# Patient Record
Sex: Male | Born: 1995 | Race: Black or African American | Hispanic: No | Marital: Single | Smoking: Never smoker
Health system: Southern US, Community
[De-identification: ages and names within clinical notes are randomized; demographics above are authoritative.]

## PROBLEM LIST (undated history)

## (undated) DIAGNOSIS — R569 Unspecified convulsions: Secondary | ICD-10-CM

## (undated) DIAGNOSIS — G43909 Migraine, unspecified, not intractable, without status migrainosus: Secondary | ICD-10-CM

## (undated) HISTORY — DX: Migraine, unspecified, not intractable, without status migrainosus: G43.909

---

## 2017-02-08 ENCOUNTER — Emergency Department (HOSPITAL_COMMUNITY)
Admission: EM | Admit: 2017-02-08 | Discharge: 2017-02-09 | Disposition: A | Discharge status: Home / Self Care | Payer: PRIVATE HEALTH INSURANCE | Attending: Emergency Medicine | Admitting: Emergency Medicine

## 2017-02-08 DIAGNOSIS — R509 Fever, unspecified: Secondary | ICD-10-CM | POA: Diagnosis not present

## 2017-02-08 DIAGNOSIS — Z79899 Other long term (current) drug therapy: Secondary | ICD-10-CM | POA: Insufficient documentation

## 2017-02-08 MED ORDER — ACETAMINOPHEN 325 MG PO TABS
650.0000 mg | ORAL_TABLET | Freq: Once | ORAL | Status: AC | PRN
  Administered 2017-02-08: 650 mg via ORAL
  Filled 2017-02-08: qty 2

## 2017-02-08 NOTE — ED Triage Notes (Addendum)
Pt reports weakness, headache and fatigue since he woke up this morning. He states that he doesn't know if he had a seizure today, but he has had one in the past. Denies abdominal pain at this time, but states that he had some earlier this week. He reports "feeling an aura." A&Ox4, but groggy. Ambulatory.

## 2017-02-09 ENCOUNTER — Emergency Department (HOSPITAL_COMMUNITY): Payer: PRIVATE HEALTH INSURANCE

## 2017-02-09 LAB — COMPREHENSIVE METABOLIC PANEL
ALBUMIN: 4 g/dL (ref 3.5–5.0)
ALK PHOS: 67 U/L (ref 38–126)
ALT: 35 U/L (ref 17–63)
AST: 27 U/L (ref 15–41)
Anion gap: 10 (ref 5–15)
BILIRUBIN TOTAL: 0.6 mg/dL (ref 0.3–1.2)
BUN: 9 mg/dL (ref 6–20)
CALCIUM: 8.8 mg/dL — AB (ref 8.9–10.3)
CO2: 24 mmol/L (ref 22–32)
CREATININE: 1.23 mg/dL (ref 0.61–1.24)
Chloride: 104 mmol/L (ref 101–111)
GFR calc Af Amer: >60 mL/min (ref >60)
GFR calc non Af Amer: >60 mL/min (ref >60)
GLUCOSE: 98 mg/dL (ref 65–99)
Potassium: 4.2 mmol/L (ref 3.5–5.1)
SODIUM: 138 mmol/L (ref 135–145)
Total Protein: 8.1 g/dL (ref 6.5–8.1)

## 2017-02-09 LAB — URINALYSIS, ROUTINE W REFLEX MICROSCOPIC
Bilirubin Urine: NEGATIVE
Glucose, UA: NEGATIVE mg/dL
HGB URINE DIPSTICK: NEGATIVE
KETONES UR: 5 mg/dL — AB
Leukocytes, UA: NEGATIVE
Nitrite: NEGATIVE
PROTEIN: NEGATIVE mg/dL
SPECIFIC GRAVITY, URINE: 1.03 (ref 1.005–1.030)
pH: 6 (ref 5.0–8.0)

## 2017-02-09 LAB — CBC WITH DIFFERENTIAL/PLATELET
Basophils Absolute: 0 10*3/uL (ref 0.0–0.1)
Basophils Relative: 1 %
EOS ABS: 0 10*3/uL (ref 0.0–0.7)
EOS PCT: 0 %
HCT: 44.8 % (ref 39.0–52.0)
Hemoglobin: 14.9 g/dL (ref 13.0–17.0)
LYMPHS ABS: 1.4 10*3/uL (ref 0.7–4.0)
Lymphocytes Relative: 23 %
MCH: 29 pg (ref 26.0–34.0)
MCHC: 33.3 g/dL (ref 30.0–36.0)
MCV: 87.2 fL (ref 78.0–100.0)
MONO ABS: 0.5 10*3/uL (ref 0.1–1.0)
MONOS PCT: 9 %
Neutro Abs: 4.3 10*3/uL (ref 1.7–7.7)
Neutrophils Relative %: 67 %
PLATELETS: 254 10*3/uL (ref 150–400)
RBC: 5.14 MIL/uL (ref 4.22–5.81)
RDW: 12.9 % (ref 11.5–15.5)
WBC: 6.3 10*3/uL (ref 4.0–10.5)

## 2017-02-09 LAB — I-STAT CG4 LACTIC ACID, ED: Lactic Acid, Venous: 0.94 mmol/L (ref 0.5–1.9)

## 2017-02-09 MED ORDER — ONDANSETRON 8 MG PO TBDP: 8.0000 mg | ORAL_TABLET | Freq: Three times a day (TID) | ORAL | 0 refills | Status: DC | PRN

## 2017-02-09 MED ORDER — IBUPROFEN 200 MG PO TABS
600.0000 mg | ORAL_TABLET | Freq: Once | ORAL | Status: AC
  Administered 2017-02-09: 600 mg via ORAL
  Filled 2017-02-09: qty 3

## 2017-02-09 NOTE — ED Provider Notes (Signed)
[facility]-EMERGENCY DEPT Provider Note   CSN: [id number] Arrival date & time: 02/08/17  2055     History   Chief Complaint Chief Complaint  Patient presents with  . Fatigue  . Fever    HPI Dylan Shepard is a 21 y.o. male.  HPI Patient reports fever over the past 24 hours with associated nausea.  Reports some crampy abdominal pain but no abdominal pain this time.  Denies diarrhea.  Denies vomiting.  Reports headache and decreased oral intake today.  Feels better after IV fluids.  Denies sore throat or earache.  No recent sick contacts.  No rash.  Symptoms are mild to moderate in severity.  He did not try any medication first fever prior to arrival.   No past medical history on file.  There are no active problems to display for this patient.   No past surgical history on file.     Home Medications    Prior to Admission medications   Medication Sig Start Date End Date Taking? Authorizing Provider  levETIRAcetam (KEPPRA) 500 MG tablet Take 1,250 mg by mouth 2 (two) times daily.   Yes [provider]    Family History No family history on file.  Social History Social History  Substance Use Topics  . Smoking status: Not on file  . Smokeless tobacco: Not on file  . Alcohol use Not on file     Allergies   Patient has no known allergies.   Review of Systems Review of Systems  All other systems reviewed and are negative.    Physical Exam Updated Vital Signs BP 121/61   Pulse 68   Temp (!) 102.5 F (39.2 C) (Oral)   Resp 18   Ht 5\' 4"  (1.626 m)   SpO2 100%   Physical Exam  Constitutional: He is oriented to person, place, and time.  HENT:  Head: Normocephalic and atraumatic.  Posterior pharynx is normal.  Uvula is midline.  Bilateral TMs are normal.  Eyes: EOM are normal.  Neck: Normal range of motion.  Cardiovascular: Normal rate, regular rhythm and intact distal pulses.   Pulmonary/Chest: Effort normal and breath sounds normal. No respiratory  distress.  Abdominal: He exhibits no distension. There is no tenderness.  Musculoskeletal: Normal range of motion.  Neurological: He is alert and oriented to person, place, and time.  Skin: Skin is warm and dry. No rash noted.  Nursing note and vitals reviewed.    ED Treatments / Results  Labs (all labs ordered are listed, but only abnormal results are displayed) Labs Reviewed  COMPREHENSIVE METABOLIC PANEL - Abnormal; Notable for the following:       Result Value   Calcium 8.8 (*)    All other components within normal limits  URINALYSIS, ROUTINE W REFLEX MICROSCOPIC - Abnormal; Notable for the following:    Ketones, ur 5 (*)    All other components within normal limits  CBC WITH DIFFERENTIAL/PLATELET  I-STAT CG4 LACTIC ACID, ED    EKG  EKG Interpretation None       Radiology Dg Chest 2 View  Result Date: 02/09/2017 CLINICAL DATA:  Weakness, headache, and fatigue since this morning. Fever. Possible seizure today. History of seizures. EXAM: CHEST  2 VIEW COMPARISON:  None. FINDINGS: The heart size and mediastinal contours are within normal limits. Both lungs are clear. The visualized skeletal structures are unremarkable. IMPRESSION: No active cardiopulmonary disease. Electronically Signed   By: Burman NievesWilliam  Stevens M.D.   On: 02/09/2017 00:59  Procedures Procedures (including critical care time)  Medications Ordered in ED Medications  acetaminophen (TYLENOL) tablet 650 mg (650 mg Oral Given 02/08/17 2109)  ibuprofen (ADVIL,MOTRIN) tablet 600 mg (600 mg Oral Given 02/09/17 0244)     Initial Impression / Assessment and Plan / ED Course  I have reviewed the triage vital signs and the nursing notes.  Pertinent labs & imaging results that were available during my care of the patient were reviewed by me and considered in my medical decision making (see chart for details).     Well-appearing.  Likely viral illness.  No meningeal signs.  Feels better after fluids.  Discharge  home in good condition.  Understands return the ER for new or worsening symptoms  Final Clinical Impressions(s) / ED Diagnoses   Final diagnoses:  Fever, unspecified fever cause    New Prescriptions New Prescriptions   No medications on file     Azalia Bilisampos, Annelise Mccoy, MD 02/09/17 0301

## 2020-06-22 ENCOUNTER — Other Ambulatory Visit: Payer: Self-pay

## 2020-06-22 ENCOUNTER — Emergency Department (HOSPITAL_COMMUNITY): Payer: PRIVATE HEALTH INSURANCE

## 2020-06-22 ENCOUNTER — Encounter (HOSPITAL_COMMUNITY): Payer: Self-pay

## 2020-06-22 ENCOUNTER — Emergency Department (HOSPITAL_COMMUNITY)
Admission: EM | Admit: 2020-06-22 | Discharge: 2020-06-22 | Disposition: A | Discharge status: Home / Self Care | Payer: PRIVATE HEALTH INSURANCE | Attending: Emergency Medicine | Admitting: Emergency Medicine

## 2020-06-22 DIAGNOSIS — R569 Unspecified convulsions: Secondary | ICD-10-CM | POA: Diagnosis present

## 2020-06-22 HISTORY — DX: Unspecified convulsions: R56.9

## 2020-06-22 LAB — CBC WITH DIFFERENTIAL/PLATELET
Abs Immature Granulocytes: 0.05 10*3/uL (ref 0.00–0.07)
Basophils Absolute: 0 10*3/uL (ref 0.0–0.1)
Basophils Relative: 1 %
Eosinophils Absolute: 0.1 10*3/uL (ref 0.0–0.5)
Eosinophils Relative: 1 %
HCT: 43.7 % (ref 39.0–52.0)
Hemoglobin: 13.9 g/dL (ref 13.0–17.0)
Immature Granulocytes: 1 %
Lymphocytes Relative: 31 %
Lymphs Abs: 1.7 10*3/uL (ref 0.7–4.0)
MCH: 28 pg (ref 26.0–34.0)
MCHC: 31.8 g/dL (ref 30.0–36.0)
MCV: 87.9 fL (ref 80.0–100.0)
Monocytes Absolute: 0.3 10*3/uL (ref 0.1–1.0)
Monocytes Relative: 6 %
Neutro Abs: 3.3 10*3/uL (ref 1.7–7.7)
Neutrophils Relative %: 60 %
Platelets: 281 10*3/uL (ref 150–400)
RBC: 4.97 MIL/uL (ref 4.22–5.81)
RDW: 12.7 % (ref 11.5–15.5)
WBC: 5.4 10*3/uL (ref 4.0–10.5)
nRBC: 0 % (ref 0.0–0.2)

## 2020-06-22 LAB — COMPREHENSIVE METABOLIC PANEL
ALT: 21 U/L (ref 0–44)
AST: 19 U/L (ref 15–41)
Albumin: 3.3 g/dL — ABNORMAL LOW (ref 3.5–5.0)
Alkaline Phosphatase: 56 U/L (ref 38–126)
Anion gap: 9 (ref 5–15)
BUN: 12 mg/dL (ref 6–20)
CO2: 22 mmol/L (ref 22–32)
Calcium: 8.3 mg/dL — ABNORMAL LOW (ref 8.9–10.3)
Chloride: 106 mmol/L (ref 98–111)
Creatinine, Ser: 0.85 mg/dL (ref 0.61–1.24)
GFR, Estimated: >60 mL/min (ref >60)
Glucose, Bld: 103 mg/dL — ABNORMAL HIGH (ref 70–99)
Potassium: 3.8 mmol/L (ref 3.5–5.1)
Sodium: 137 mmol/L (ref 135–145)
Total Bilirubin: 0.5 mg/dL (ref 0.3–1.2)
Total Protein: 6.4 g/dL — ABNORMAL LOW (ref 6.5–8.1)

## 2020-06-22 LAB — RAPID URINE DRUG SCREEN, HOSP PERFORMED
Amphetamines: NOT DETECTED
Barbiturates: NOT DETECTED
Benzodiazepines: NOT DETECTED
Cocaine: NOT DETECTED
Opiates: NOT DETECTED
Tetrahydrocannabinol: NOT DETECTED

## 2020-06-22 LAB — URINALYSIS, ROUTINE W REFLEX MICROSCOPIC
Bilirubin Urine: NEGATIVE
Glucose, UA: NEGATIVE mg/dL
Hgb urine dipstick: NEGATIVE
Ketones, ur: NEGATIVE mg/dL
Leukocytes,Ua: NEGATIVE
Nitrite: NEGATIVE
Protein, ur: NEGATIVE mg/dL
Specific Gravity, Urine: 1.024 (ref 1.005–1.030)
pH: 7 (ref 5.0–8.0)

## 2020-06-22 MED ORDER — LORAZEPAM 1 MG PO TABS: 0.5000 mg | ORAL_TABLET | Freq: Once | ORAL | Status: DC

## 2020-06-22 MED ORDER — LEVETIRACETAM IN NACL 1000 MG/100ML IV SOLN
1000.0000 mg | Freq: Once | INTRAVENOUS | Status: AC
  Administered 2020-06-22: 1000 mg via INTRAVENOUS
  Filled 2020-06-22: qty 100

## 2020-06-22 MED ORDER — CLONAZEPAM 0.5 MG PO TABS
0.5000 mg | ORAL_TABLET | Freq: Once | ORAL | Status: AC
  Administered 2020-06-22: 0.5 mg via ORAL
  Filled 2020-06-22: qty 1

## 2020-06-22 MED ORDER — LACOSAMIDE 50 MG PO TABS: 50.0000 mg | ORAL_TABLET | Freq: Two times a day (BID) | ORAL | 0 refills | Status: DC

## 2020-06-22 NOTE — ED Provider Notes (Signed)
[facility] EMERGENCY DEPARTMENT Provider Note   CSN: [id number] Arrival date & time: 06/22/20  0554     History Chief Complaint  Patient presents with  . Seizures    Dylan Shepard is a 24 y.o. male.  HPI       Presents with concern for seizure Hx of epilepsy onh keppra Increased dose to 1500 BID since the beginning of October Have not missed any doses Neurologist in [place], hx of seizures since 24yo Tonic clonic activity with right arm and leg Had 2 apparent seizures at work today, they said he did fall and hit his head with second seizure Remembers being at work for 1.5hr but doesn't remember what else  No fevers, cough, congestion, urinary symptoms, vomiting/diarrhea No change in medicines or OTC medications, occ etoh No recent caffeine, last had etoh on Sunday  Works 2 jobs, gets off of other at [employer], went to work at [employer] at [employer], not sleeping much Has Been taking medications  Past Medical History:  Diagnosis Date  . Seizures (HCC)     There are no problems to display for this patient.     No family history on file.  Social History   Tobacco Use  . Smoking status: Not on file  Substance Use Topics  . Alcohol use: Not on file  . Drug use: Not on file    Home Medications Prior to Admission medications   Medication Sig Start Date End Date Taking? Authorizing Provider  levETIRAcetam (KEPPRA) 500 MG tablet Take 1,250 mg by mouth 2 (two) times daily.    [provider]  ondansetron (ZOFRAN ODT) 8 MG disintegrating tablet Take 1 tablet (8 mg total) by mouth every 8 (eight) hours as needed for nausea or vomiting. 02/09/17   Azalia Bilis, MD    Allergies    Patient has no known allergies.  Review of Systems   Review of Systems  Constitutional: Negative for fever.  HENT: Negative for sore throat.   Eyes: Negative for visual disturbance.  Respiratory: Negative for shortness of breath.   Cardiovascular: Negative for  chest pain.  Gastrointestinal: Negative for abdominal pain, nausea and vomiting.  Genitourinary: Negative for difficulty urinating.  Musculoskeletal: Negative for back pain and neck stiffness.  Skin: Negative for rash.  Neurological: Positive for seizures, syncope and headaches (slight).    Physical Exam Updated Vital Signs BP 135/79   Pulse 77   Temp 98.1 F (36.7 C) (Oral)   Resp (!) 22   Ht 5\' 4"  (1.626 m)   Wt 94.3 kg   SpO2 97%   BMI 35.70 kg/m   Physical Exam Vitals and nursing note reviewed.  Constitutional:      General: He is not in acute distress.    Appearance: He is well-developed. He is not diaphoretic.  HENT:     Head: Normocephalic and atraumatic.  Eyes:     Conjunctiva/sclera: Conjunctivae normal.  Cardiovascular:     Rate and Rhythm: Normal rate and regular rhythm.  Pulmonary:     Effort: Pulmonary effort is normal. No respiratory distress.  Musculoskeletal:     Cervical back: Normal range of motion.  Skin:    General: Skin is warm and dry.  Neurological:     Mental Status: He is alert and oriented to person, place, and time.     Cranial Nerves: No cranial nerve deficit.     Sensory: No sensory deficit.     Motor: No weakness.  Comments: Dysconjugate gaze baseline      ED Results / Procedures / Treatments   Labs (all labs ordered are listed, but only abnormal results are displayed) Labs Reviewed  URINE CULTURE  CBC WITH DIFFERENTIAL/PLATELET  COMPREHENSIVE METABOLIC PANEL  RAPID URINE DRUG SCREEN, HOSP PERFORMED  URINALYSIS, ROUTINE W REFLEX MICROSCOPIC    EKG EKG Interpretation  Date/Time:  Wednesday June 22 2020 06:03:12 EST Ventricular Rate:  74 PR Interval:    QRS Duration: 85 QT Interval:  369 QTC Calculation: 410 R Axis:   63 Text Interpretation: Sinus rhythm Borderline ST elevation, lateral leads No previous ECGs available Confirmed by Alvira Monday ([id number]) on 06/22/2020 6:17:40 AM   Radiology CT Head Wo  Contrast  Result Date: 06/22/2020 CLINICAL DATA:  Seizure. Fell. Hit head. EXAM: CT HEAD WITHOUT CONTRAST TECHNIQUE: Contiguous axial images were obtained from the base of the skull through the vertex without intravenous contrast. COMPARISON:  None. FINDINGS: Brain: The ventricles are normal in size and configuration. No extra-axial fluid collections are identified. The gray-white differentiation is maintained. No CT findings for acute hemispheric infarction or intracranial hemorrhage. No mass lesions. The brainstem and cerebellum are normal. Vascular: No hyperdense vessels or obvious aneurysm. Skull: No acute skull fracture. No bone lesion. Sinuses/Orbits: The paranasal sinuses and mastoid air cells are clear. The globes are intact. Other: No scalp lesions, laceration or hematoma. Incidental incomplete anterior arch of C1 noted. IMPRESSION: Normal head CT. Electronically Signed   By: Rudie Meyer M.D.   On: 06/22/2020 06:48    Procedures Procedures (including critical care time)  Medications Ordered in ED Medications  clonazePAM (KLONOPIN) tablet 0.5 mg (has no administration in time range)  levETIRAcetam (KEPPRA) IVPB 1000 mg/100 mL premix (1,000 mg Intravenous New Bag/Given 06/22/20 0701)    ED Course  I have reviewed the triage vital signs and the nursing notes.  Pertinent labs & imaging results that were available during my care of the patient were reviewed by me and considered in my medical decision making (see chart for details).    MDM Rules/Calculators/A&P                          24yo male with history of epilepsy presents with concern for seizures.  CT head done given fall, head trauma and headache.  Labs obtained and pending.  Sleep deprivation high likelihood of seizure trigger.  He has been compliant with medications, denies other recent illness or concerns.  Discussed with Neurology. Given return to baseline (assuming no other significant lab abnormalities) so not feel  admission required. Will give .5mg  clonazepam and plan to initiate Vimpat 50mg  BID as he calls his Neurologist for instructions.  Care signed out to Dr. with labwork pending.   Final Clinical Impression(s) / ED Diagnoses Final diagnoses:  Seizure [facility])    Rx / DC Orders ED Discharge Orders    None       Particia Nearing, MD 06/22/20 [phone]

## 2020-06-22 NOTE — ED Notes (Signed)
Pt provided with urinal

## 2020-06-22 NOTE — ED Provider Notes (Addendum)
Pt signed out by Dr. Dalene Seltzer pending labs.  Pt's labs are reassuring.  He does not have a local neurologist, so he is given a referral to neurology.  She discussed pt with Dr. Amada Jupiter who recommended vimpat.  Pt has not been getting enough sleep, so he is encouraged to get at least 8 hrs of sleep.  He is also told not to drive for at least 6 months until cleared by neurologist.       Jacalyn Lefevre, MD 06/22/20 6378    Jacalyn Lefevre, MD 06/22/20 [phone]

## 2020-06-22 NOTE — ED Triage Notes (Signed)
Pt to ER from work ([employer]) by EMS; EMS report pt had two apparent seizures at work; pt found down by coworkers (seizure presumed), and second seizure witnessed a few minutes later by coworkers who say patient has tonic clonic activity with right arm and leg; report pt fell and hit head with second seizure. EMS report about 5 min postictal activity on their arrival, then A&O.  PT A&O on arrival.  Per EMS, pt takes 750 mg Keppra BID.  EMS also report, per their records, pt's recent EEG reports changes to left brain.

## 2020-06-22 NOTE — Discharge Instructions (Addendum)
It is very important that you get at least 8 hours of sleep per night.  You must be seizure free for at least 6 months before you can drive.  You need to be cleared by your neurologist.

## 2020-06-23 LAB — URINE CULTURE: Culture: NO GROWTH

## 2020-08-03 ENCOUNTER — Telehealth: Payer: Self-pay | Admitting: Neurology

## 2020-08-03 NOTE — Telephone Encounter (Signed)
Pt's mother, Jose Persia (on [place]) called, Can you request medical records from [facility]. Would like a call from the nurse.

## 2020-08-03 NOTE — Telephone Encounter (Signed)
Dylan Shepard,  Can you help with this? They are scheduled as a new patient to see Dr. Pearlean Brownie on 08/24/20 at 9am. Thank you

## 2020-08-24 ENCOUNTER — Ambulatory Visit (INDEPENDENT_AMBULATORY_CARE_PROVIDER_SITE_OTHER): Payer: PRIVATE HEALTH INSURANCE | Admitting: Neurology

## 2020-08-24 ENCOUNTER — Encounter: Payer: Self-pay | Admitting: Neurology

## 2020-08-24 ENCOUNTER — Telehealth: Payer: Self-pay | Admitting: Neurology

## 2020-08-24 VITALS — BP 143/68 | HR 92 | Ht 64.0 in | Wt 224.6 lb

## 2020-08-24 DIAGNOSIS — G40309 Generalized idiopathic epilepsy and epileptic syndromes, not intractable, without status epilepticus: Secondary | ICD-10-CM

## 2020-08-24 DIAGNOSIS — G40001 Localization-related (focal) (partial) idiopathic epilepsy and epileptic syndromes with seizures of localized onset, not intractable, with status epilepticus: Secondary | ICD-10-CM

## 2020-08-24 DIAGNOSIS — R569 Unspecified convulsions: Secondary | ICD-10-CM | POA: Diagnosis not present

## 2020-08-24 MED ORDER — LEVETIRACETAM 750 MG PO TABS: 1500.0000 mg | ORAL_TABLET | Freq: Two times a day (BID) | ORAL | 11 refills | Status: DC

## 2020-08-24 NOTE — Progress Notes (Signed)
[facility] [address] [place]. [place] [address] [phone]       OFFICE CONSULT NOTE  [name] Date of Birth:  1995/09/23 Medical Record Number:  [id number]   Referring MD: Jacalyn Lefevre  Reason for Referral: Seizure disorder  HPI: Dylan Shepard is a 25 year old African-American male seen today for initial consultation visit for seizures.  He is accompanied by his mother.  History is obtained from them and review of recent electronic medical records from ER visit.  Patient states he has history of seizure disorder since age 25 when he was in seventh grade.  Seizures are usually generalized tonic-clonic without any aura.  He is unconscious.  Usually is out for about 5 minutes then wakes up disoriented confused and is tired for the rest of the day.  Denies specific aura or triggers except running out of medications for being under stress.  He has been on Keppra before and was on 1250 mg twice daily for 6 years when the dose was increased 2 months ago to 1500 twice daily as he had a couple of breakthrough seizures.  His seizure frequency has been barely 1 or 2/year until 2015 and when he went to college his seizure frequency went up and this was related to stress and hence the dose of Keppra was increased from 500 twice daily to 1250 twice daily.  After he left college is done well and did not have any breakthrough seizures until October December last year when he had 3 or 4 seizures in that.  Patient attribute this to his altered sleep pattern since he was working some jobs where he had to work night shifts and did not get reliable sleep.  Patient ran out of seizure medications and went to the ER on 06/22/2020 and the ER physician spoke to neurologist on-call who recommended adding Vimpat 50 mg twice daily to his Keppra but patient has not filled that prescription and remains on Keppra alone.  He has had no more seizures since the ER visit 2 months ago.  The patient's mother  informs me that patient probably had some perinatal hypoxia is she needed him emergency C-section since patient was stuck in the birth canal during her delivery.  Patient has had decreased size of his right eye and face but is not sure if he has decreased size of the right arm or leg he has fairly good strength.  He has not had any intellectual or milestone delays.  Patient was followed at [facility] neurology in [place] and he has recently taken up a job in [place] and moved here but I do not have any of those records.  Patient has shown me EEG report from [facility] in [place] [facility] which he has on his cell phone from 04/21/2020 which showed left temporal sharp waves with phase reversal at T3.  ROS:   14 system review of systems is positive for seizures, tiredness, confusion, disorientation, loss of consciousness all other systems negative PMH:  Past Medical History:  Diagnosis Date  . Seizures (HCC)     Social History:  Social History   Socioeconomic History  . Marital status: Single    Spouse name: Not on file  . Number of children: Not on file  . Years of education: Not on file  . Highest education level: Not on file  Occupational History  . Occupation: part time x2 jobs  Tobacco Use  . Smoking status: Never Smoker  . Smokeless tobacco:  Never Used  Substance and Sexual Activity  . Alcohol use: Yes    Comment: occassionally  . Drug use: Never  . Sexual activity: Not on file  Other Topics Concern  . Not on file  Social History Narrative   Lives with roommate/college   Right Handed   Drinks 1 cup daily   Social Determinants of Health   Financial Resource Strain: Not on file  Food Insecurity: Not on file  Transportation Needs: Not on file  Physical Activity: Not on file  Stress: Not on file  Social Connections: Not on file  Intimate Partner Violence: Not on file    Medications:   Current Outpatient Medications on File Prior to Visit   Medication Sig Dispense Refill  . lacosamide (VIMPAT) 50 MG TABS tablet Take 1 tablet (50 mg total) by mouth 2 (two) times daily. 60 tablet 0  . ondansetron (ZOFRAN ODT) 8 MG disintegrating tablet Take 1 tablet (8 mg total) by mouth every 8 (eight) hours as needed for nausea or vomiting. 10 tablet 0   No current facility-administered medications on file prior to visit.    Allergies:   Allergies  Allergen Reactions  . Sudafed [Pseudoephedrine]     Not allergic, just interacts with Keppra    Physical Exam General: Obese young African-American male, seated, in no evident distress Head: head normocephalic and atraumatic.   Neck: supple with no carotid or supraclavicular bruits Cardiovascular: regular rate and rhythm, no murmurs Musculoskeletal: no deformity Skin:  no rash/petichiae Vascular:  Normal pulses all extremities  Neurologic Exam Mental Status: Awake and fully alert. Oriented to place and time. Recent and remote memory intact. Attention span, concentration and fund of knowledge appropriate. Mood and affect appropriate.  Cranial Nerves: Right eye is small in caliber with decreased palpebral fissure.  Fundoscopic exam reveals sharp disc margins. Pupils equal, briskly reactive to light. Extraocular movements full without nystagmus. Visual fields full to confrontation. Hearing intact.  Very subtle hemiatrophy of the right face compared to the left.  Sensation intact. Face, tongue, palate moves normally and symmetrically.  Motor: Normal bulk and tone. Normal strength in all tested extremity muscles.  Mild right grip weakness. Sensory.: intact to touch , pinprick , position and vibratory sensation.  Coordination: Rapid alternating movements normal in all extremities. Finger-to-nose and heel-to-shin performed accurately bilaterally. Gait and Station: Arises from chair without difficulty. Stance is normal. Gait demonstrates normal stride length and balance . Able to heel, toe and tandem  walk without difficulty.  Reflexes: 1+ and symmetric. Toes downgoing.       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     PLAN: I had a long discussion with the patient and his mother regarding his epilepsy and recent breakthrough seizures which are likely related to his altered sleep pattern and stress.  I agree with continuing Keppra 1500 mg twice daily for seizure prophylaxis and recommend he avoid seizure triggers like stress and sleep deprivation.  Encouraged him to maintain regular eating, sleeping and exercise habits and avoid extremes.  He was encouraged to be compliant with his medications.  Check EEG and MRI scan of the brain and will obtain his prior neurological records from [facility] neurology in [place].  He was advised not to drive for 6 months since his last seizure as per [facility].  He will return for follow-up in 3 months with my nurse practitioner Shanda Bumps or call earlier if necessary.  Greater than 50% time during this 45-minute consultation was it was spent on counseling and coordination of care about his seizures and discussion about seizure prevention and answering questions. Delia Heady, MD  [facility] [address] [place], [place] [address]  Phone [phone] Fax [phone] Note: This document was prepared with digital dictation and possible smart phrase technology. Any transcriptional errors that result from this process are unintentional.

## 2020-08-24 NOTE — Telephone Encounter (Signed)
[employer] order sent to GI. They will obtain the auth and reach out to the patient to schedule.  

## 2020-08-24 NOTE — Patient Instructions (Signed)
I had a long discussion with the patient and his mother regarding his epilepsy and recent breakthrough seizures which are likely related to his altered sleep pattern and stress.  I agree with continuing Keppra 1500 mg twice daily for seizure prophylaxis and recommend he avoid seizure triggers like stress and sleep deprivation.  Encouraged him to maintain regular eating, sleeping and exercise habits and avoid extremes.  He was encouraged to be compliant with his medications.  Check EEG and MRI scan of the brain and will obtain his prior neurological records from [facility] neurology in [place].  He was advised not to drive for 6 months since his last seizure as per [facility].  He will return for follow-up in 3 months with my nurse practitioner Shanda Bumps or call earlier if necessary.  Epilepsy Epilepsy is when a person keeps having seizures. A seizure is a burst of abnormal activity in the brain. This condition can cause problems such as:  A change in how you think or behave.  Trouble knowing what is happening.  Falls, accidents, and injury.  Sadness (depression).  Poor memory. In rare cases, this condition can be life-threatening. But most people with epilepsy lead normal lives. What are the causes?  A head injury or an injury that happens at birth.  A high fever during childhood.  A stroke.  Bleeding into or around the brain.  Some medicines and drugs.  Having too little oxygen for a long time.  Abnormal brain development.  Conditions such as: ? Brain infection. ? Brain tumors. ? Conditions that are passed from parent to child. Many times, the cause is not known. What are the signs or symptoms? Symptoms of a seizure vary from person to person. They may include: Symptoms during a seizure  Shaking with fast, jerky movements of muscles (convulsions).  Stiffness of the body.  Breathing problems.  Being mixed up (confused).  Staring or being  hard to wake up (being unresponsive).  Head nodding, eye blinking, eye twitching, or fast eye movements.  Drooling, grunting, or making clicking sounds with your mouth.  Not being able to control when you pee or poop. Symptoms before a seizure  Feeling afraid, worried, or nervous.  Feeling like you may vomit.  Vertigo. This feels like: ? You are moving when you are not. ? Things around you are moving when they are not.  Dj vu. This is a feeling of having seen or heard something before.  Odd tastes or smells.  Changes in how you see, such as seeing flashing lights or spots. Symptoms after a seizure  Being confused.  Being sleepy.  A headache.  Sore muscles. How is this treated? Treatment can control seizures. It may include:  Taking medicines.  Having a device put in the chest (vagus nerve stimulator).  Brain surgery.  Having blood tests often.  Eating foods that are low in carbohydrates and high in fat (ketogenic diet). If you are diagnosed with this condition, you should start treatment as soon as you can. Follow these instructions at home: Medicines  Take over-the-counter and prescription medicines only as told by your doctor.  Avoid anything that may keep your medicine from working, such as alcohol. Activity  Get enough rest.  Follow your doctor's advice about driving, swimming, and doing other things that would be dangerous if you had a seizure.  If you live in the [place] ask your local department of motor vehicles about local driving laws for people with epilepsy.  Teaching others  Teach friends and family what to do if you have a seizure. Tell them to: ? Help you get down to the ground. ? Put a pillow under your head and body. ? Loosen any clothing around your neck. ? Turn you on your side. ? Stay with you until you are better. ? Know whether or not you need emergency care.  Also, tell them what not to do if you have a seizure. Tell  them: ? They should not hold you down. ? They should not put anything in your mouth.   General instructions  Avoid things that cause you to have seizures.  Keep a seizure diary. Write down: ? What you remember about each seizure. ? What might have caused the seizure.  Keep all follow-up visits. Where to find more information  Epilepsy Foundation: epilepsy.com  International League Against Epilepsy: ilae.org Contact a doctor if:  You have a change in how often or when you have seizures.  You get an infection or start to feel sick.  You are not able to take your medicine. Get help right away if:  A seizure does not stop after 5 minutes.  You have more than one seizure in a row, and you do not have enough time between the seizures to feel better.  A seizure makes it harder to breathe.  A seizure is different from other seizures you have had.  A seizure makes you unable to speak or use a part of your body.  You did not wake up right away after a seizure.  You feel sad, and this does not get better. These symptoms may be an emergency. Get help right away. Call your local emergency services (911 in the [place]  Do not wait to see if the symptoms will go away.  Do not drive yourself to the hospital. Get help right awayif you feel like you may hurt yourself or others, or have thoughts about taking your own life. Go to your nearest emergency room or:  Call your local emergency services (911 in the [place]  Call the National Suicide Prevention Lifeline at (437) 505-5256. This is open 24 hours a day.  Text the Crisis Text Line at (580)842-5627. Summary  Epilepsy is when a person keeps having seizures.  Seizures can cause many symptoms, such as brief staring and shaking or jerky muscle movements.  Treatment can control seizures. Take over-the-counter and prescription medicines only as told by your doctor.  Follow your doctor's advice about driving, swimming, and doing other  things that would be dangerous if you had a seizure.  Teach friends and family what to do if you have a seizure. This information is not intended to replace advice given to you by your health care provider. Make sure you discuss any questions you have with your health care provider. Document Revised: 01/04/2020 Document Reviewed: 01/04/2020 Elsevier Patient Education  2021 [employer].

## 2020-09-07 ENCOUNTER — Telehealth: Payer: Self-pay | Admitting: *Deleted

## 2020-09-07 NOTE — Telephone Encounter (Signed)
Done request faxed 2 to [facility] [phone]

## 2020-09-10 ENCOUNTER — Ambulatory Visit
Admission: RE | Admit: 2020-09-10 | Discharge: 2020-09-10 | Disposition: A | Discharge status: Home / Self Care | Payer: PRIVATE HEALTH INSURANCE | Source: Ambulatory Visit | Attending: Neurology | Admitting: Neurology

## 2020-09-10 ENCOUNTER — Other Ambulatory Visit: Payer: Self-pay

## 2020-09-10 DIAGNOSIS — G40309 Generalized idiopathic epilepsy and epileptic syndromes, not intractable, without status epilepticus: Secondary | ICD-10-CM

## 2020-09-10 MED ORDER — GADOBENATE DIMEGLUMINE 529 MG/ML IV SOLN
20.0000 mL | Freq: Once | INTRAVENOUS | Status: AC | PRN
  Administered 2020-09-10: 20 mL via INTRAVENOUS

## 2020-09-12 ENCOUNTER — Ambulatory Visit: Payer: PRIVATE HEALTH INSURANCE | Admitting: Neurology

## 2020-09-12 ENCOUNTER — Telehealth: Payer: Self-pay | Admitting: Neurology

## 2020-09-12 DIAGNOSIS — G40309 Generalized idiopathic epilepsy and epileptic syndromes, not intractable, without status epilepticus: Secondary | ICD-10-CM

## 2020-09-12 NOTE — Telephone Encounter (Signed)
We received medical information from [facility] neurology.  EEG study shows abnormal awake and asleep EEG due to the presence of phase reversing sharp waves over the left temporal region.  This is felt to be consistent with a focal area of neuronal dysfunction with epileptogenic potential over the left temporal area.  No electrographic seizures were seen.  EEG was done on 15 April 2020.  The patient was seen by Dr. Gwendalyn Ege on 07 April 2020.  Keppra was increased to 1500 mg twice daily at that time.  The patient has been seizure-free from 2013 until about a year prior to that visit.  The patient awakened with a metallic taste and smell and felt achy all over and was sensitive to light.    MRI of the brain was done on 27 January 2015.  This was felt to be a normal study without gadolinium.

## 2020-09-16 NOTE — Progress Notes (Signed)
Kindly inform the patient that MRI study of the brain shows no abnormalities

## 2020-09-19 ENCOUNTER — Encounter: Payer: Self-pay | Admitting: *Deleted

## 2020-09-20 ENCOUNTER — Encounter: Payer: Self-pay | Admitting: *Deleted

## 2020-09-20 NOTE — Progress Notes (Signed)
Kindly inform the patient that EEG study was normal

## 2020-11-28 ENCOUNTER — Ambulatory Visit: Payer: PRIVATE HEALTH INSURANCE | Admitting: Adult Health

## 2020-12-13 ENCOUNTER — Other Ambulatory Visit: Payer: Self-pay

## 2020-12-13 ENCOUNTER — Encounter: Payer: Self-pay | Admitting: Emergency Medicine

## 2020-12-13 ENCOUNTER — Ambulatory Visit (INDEPENDENT_AMBULATORY_CARE_PROVIDER_SITE_OTHER): Payer: PRIVATE HEALTH INSURANCE

## 2020-12-13 ENCOUNTER — Ambulatory Visit
Admission: EM | Admit: 2020-12-13 | Discharge: 2020-12-13 | Disposition: A | Discharge status: Home / Self Care | Payer: PRIVATE HEALTH INSURANCE | Attending: Family Medicine | Admitting: Family Medicine

## 2020-12-13 DIAGNOSIS — R000 Tachycardia, unspecified: Secondary | ICD-10-CM

## 2020-12-13 DIAGNOSIS — J988 Other specified respiratory disorders: Secondary | ICD-10-CM

## 2020-12-13 DIAGNOSIS — B9789 Other viral agents as the cause of diseases classified elsewhere: Secondary | ICD-10-CM | POA: Diagnosis not present

## 2020-12-13 DIAGNOSIS — R509 Fever, unspecified: Secondary | ICD-10-CM

## 2020-12-13 DIAGNOSIS — Z1152 Encounter for screening for COVID-19: Secondary | ICD-10-CM

## 2020-12-13 DIAGNOSIS — R079 Chest pain, unspecified: Secondary | ICD-10-CM

## 2020-12-13 MED ORDER — PREDNISONE 20 MG PO TABS: 40.0000 mg | ORAL_TABLET | Freq: Every day | ORAL | 0 refills | Status: DC

## 2020-12-13 MED ORDER — IBUPROFEN 800 MG PO TABS
800.0000 mg | ORAL_TABLET | Freq: Once | ORAL | Status: AC
  Administered 2020-12-13: 800 mg via ORAL

## 2020-12-13 MED ORDER — AEROCHAMBER PLUS FLO-VU MEDIUM MISC
1.0000 | Freq: Once | Status: AC
  Administered 2020-12-13: 1

## 2020-12-13 MED ORDER — ACETAMINOPHEN 325 MG PO TABS
650.0000 mg | ORAL_TABLET | Freq: Once | ORAL | Status: AC
  Administered 2020-12-13: 650 mg via ORAL

## 2020-12-13 MED ORDER — PROMETHAZINE-DM 6.25-15 MG/5ML PO SYRP: 5.0000 mL | ORAL_SOLUTION | Freq: Four times a day (QID) | ORAL | 0 refills | Status: DC | PRN

## 2020-12-13 MED ORDER — ALBUTEROL SULFATE HFA 108 (90 BASE) MCG/ACT IN AERS
2.0000 | INHALATION_SPRAY | Freq: Once | RESPIRATORY_TRACT | Status: AC
  Administered 2020-12-13: 2 via RESPIRATORY_TRACT

## 2020-12-13 NOTE — ED Provider Notes (Addendum)
[name]    CSN: [id number] Arrival date & time: 12/13/20  1804      History   Chief Complaint Chief Complaint  Patient presents with  . Fever  . Migraine  . Chest Pain    HPI Dylan Shepard is a 25 y.o. male.   HPI  Patient with a history of epilepsy , Keppra, presents today with acute onset shortness of breath, headache, chest pain, tachycardia while at work today.  He reports he was in his well state of health yesterday.  He feels fatigue and achy all over. No history of asthma or COPD.  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   Past Medical History:  Diagnosis Date  . Seizures [facility])     Patient Active Problem List   Diagnosis Date Noted  . Generalized idiopathic epilepsy and epileptic syndromes, not intractable, without status epilepticus (HCC) 08/24/2020  . Localization-related idiopathic epilepsy and epileptic syndromes with seizures of localized onset, not intractable, with status epilepticus (HCC) 08/24/2020    History reviewed. No pertinent surgical history.     Home Medications    Prior to Admission medications   Medication Sig Start Date End Date Taking? Authorizing Provider  predniSONE (DELTASONE) 20 MG tablet Take 2 tablets (40 mg total) by mouth daily with breakfast. 12/13/20  Yes Bing Neighbors, FNP  promethazine-dextromethorphan (PROMETHAZINE-DM) 6.25-15 MG/5ML syrup Take 5 mLs by mouth 4 (four) times daily as needed for cough. 12/13/20  Yes Bing Neighbors, FNP  lacosamide (VIMPAT) 50 MG TABS tablet Take 1 tablet (50 mg total) by mouth 2 (two) times daily. 06/22/20   Jacalyn Lefevre, MD  levETIRAcetam (KEPPRA) 750 MG tablet Take 2 tablets (1,500 mg total) by mouth 2 (two) times daily. 08/24/20   Micki Riley, MD  ondansetron (ZOFRAN ODT) 8 MG disintegrating tablet Take 1 tablet (8 mg total) by mouth every 8 (eight) hours as needed for nausea or vomiting. 02/09/17   Azalia Bilis, MD    Family  History Family History  Problem Relation Age of Onset  . Hypertension Mother   . Diabetes Mother   . Hypertension Father   . Diabetes Father     Social History Social History   Tobacco Use  . Smoking status: Never Smoker  . Smokeless tobacco: Never Used  Substance Use Topics  . Alcohol use: Yes    Comment: occassionally  . Drug use: Never     Allergies   Sudafed [pseudoephedrine]   Review of Systems Review of Systems Pertinent negatives listed in HPI   Physical Exam Triage Vital Signs ED Triage Vitals  Enc Vitals Group     BP 12/13/20 1941 136/75     Pulse Rate 12/13/20 1941 (!) 129     Resp 12/13/20 1941 (!) 22     Temp 12/13/20 1941 (!) 103.1 F (39.5 C)     Temp Source 12/13/20 1941 Oral     SpO2 12/13/20 1941 95 %     Weight --      Height --      Head Circumference --      Peak Flow --      Pain Score 12/13/20 1940 10     Pain Loc --      Pain Edu? --      Excl. in GC? --    No data found.  Updated Vital Signs BP 136/75   Pulse (!) 129   Temp (!)  103.1 F (39.5 C) (Oral)   Resp (!) 22   SpO2 95%   Visual Acuity Right Eye Distance:   Left Eye Distance:   Bilateral Distance:    Right Eye Near:   Left Eye Near:    Bilateral Near:     Physical Exam Constitutional:      Appearance: He is obese. He is ill-appearing.  HENT:     Head: Normocephalic.     Nose: Congestion present.  Eyes:     Pupils: Pupils are equal, round, and reactive to light.  Cardiovascular:     Rate and Rhythm: Regular rhythm. Tachycardia present.  Pulmonary:     Effort: Tachypnea present.     Breath sounds: Wheezing and rhonchi present.  Skin:    General: Skin is warm.  Neurological:     General: No focal deficit present.     Mental Status: He is alert.     GCS: GCS eye subscore is 4. GCS verbal subscore is 5. GCS motor subscore is 6.     Motor: Motor function is intact. No weakness.     Coordination: Coordination is intact.     Gait: Gait normal.   Psychiatric:        Attention and Perception: Attention normal.        Mood and Affect: Mood normal.        Speech: Speech normal.      UC Treatments / Results  Labs (all labs ordered are listed, but only abnormal results are displayed) Labs Reviewed  COVID-19, FLU A+B NAA    EKG   Radiology DG Chest 2 View  Result Date: 12/13/2020 CLINICAL DATA:  Chest pain fever EXAM: CHEST - 2 VIEW COMPARISON:  02/09/2017 FINDINGS: The heart size and mediastinal contours are within normal limits. Both lungs are clear. The visualized skeletal structures are unremarkable. IMPRESSION: No active cardiopulmonary disease. Electronically Signed   By: Jasmine Pang M.D.   On: 12/13/2020 20:07    Procedures Procedures (including critical care time)  Medications Ordered in UC Medications  albuterol (VENTOLIN HFA) 108 (90 Base) MCG/ACT inhaler 2 puff (has no administration in time range)  AeroChamber Plus Flo-Vu Medium MISC 1 each (has no administration in time range)  acetaminophen (TYLENOL) tablet 650 mg (650 mg Oral Given 12/13/20 1947)  ibuprofen (ADVIL) tablet 800 mg (800 mg Oral Given 12/13/20 2008)    Initial Impression / Assessment and Plan / UC Course  I have reviewed the triage vital signs and the nursing notes.  Pertinent labs & imaging results that were available during my care of the patient were reviewed by me and considered in my medical decision making (see chart for details).     Chest x-ray is negative .  He has audible wheezing and rhonchi on exam therefore will cover with a burst of prednisone.  High risk for COVID or flu given temperature.  Patient has epilepsy concern for febrile seizure.  Patient was dosed with ibuprofen and Tylenol fever was reduced from 103.1to 101.9 prior to discharge.  Patient remains slightly tachycardic overall he is asymptomatic.  He has been hydrating well with fluids while here in clinic.  Given his overall appearance after interventions here in clinic  he appears stable discharging home with albuterol inhaler for shortness of breath 2 puffs every 4-6 hours as needed.  Also discharged home with Promethazine DM and prednisone.  Strict ER precautions if any of his symptoms worsen given his diagnosis of epilepsy.  Patient verbalized understanding  and agreement with plan.  COVID/Flu test pending. Symptom management warranted only.  Manage fever with Tylenol and ibuprofen.  Nasal symptoms with over-the-counter antihistamines recommended.  Treatment per discharge medications/discharge instructions.  Red flags/ER precautions given. The most current CDC isolation/quarantine recommendation advised.    Final Clinical Impressions(s) / UC Diagnoses   Final diagnoses:  Viral respiratory illness  Fever, unspecified  Tachycardia  Encounter for screening for COVID-19     Discharge Instructions     Give yourself Tylenol in 3 hours at 11:00.  Continue to monitor your temperature is important to keep your fever as low as possible as fevers can induce seizures.  If you begin to experience any aura or the sensation that you are about to have a seizure call 911 or have a family member take you directly to the emergency department.   ED Prescriptions    Medication Sig Dispense Auth. Provider   predniSONE (DELTASONE) 20 MG tablet Take 2 tablets (40 mg total) by mouth daily with breakfast. 10 tablet Bing Neighbors, FNP   promethazine-dextromethorphan (PROMETHAZINE-DM) 6.25-15 MG/5ML syrup Take 5 mLs by mouth 4 (four) times daily as needed for cough. 118 mL Bing Neighbors, FNP     PDMP not reviewed this encounter.   Bing Neighbors, FNP 12/13/20 2031    Bing Neighbors, FNP 12/13/20 2033

## 2020-12-13 NOTE — Discharge Instructions (Addendum)
Give yourself Tylenol in 3 hours at 11:00.  Continue to monitor your temperature is important to keep your fever as low as possible as fevers can induce seizures.  If you begin to experience any aura or the sensation that you are about to have a seizure call 911 or have a family member take you directly to the emergency department.

## 2020-12-13 NOTE — ED Triage Notes (Signed)
Pt is present today with migraine, fever(102), and chest tightness. Pt sx started today

## 2020-12-15 ENCOUNTER — Telehealth (HOSPITAL_COMMUNITY): Payer: Self-pay | Admitting: Emergency Medicine

## 2020-12-15 LAB — COVID-19, FLU A+B NAA
Influenza A, NAA: NOT DETECTED
Influenza B, NAA: NOT DETECTED
SARS-CoV-2, NAA: DETECTED — AB

## 2020-12-15 MED ORDER — NIRMATRELVIR/RITONAVIR (PAXLOVID)TABLET: 3.0000 | ORAL_TABLET | Freq: Two times a day (BID) | ORAL | 0 refills | Status: AC

## 2020-12-15 MED ORDER — NIRMATRELVIR/RITONAVIR (PAXLOVID)TABLET: 3.0000 | ORAL_TABLET | Freq: Two times a day (BID) | ORAL | 0 refills | Status: DC

## 2020-12-15 NOTE — Telephone Encounter (Signed)
Paxlovid for COVID positive + hx of epilepsy, per Dr. Leonides Grills, placed twice because first placement was printed in error instead of sent electronically

## 2021-01-10 ENCOUNTER — Emergency Department (HOSPITAL_COMMUNITY)
Admission: EM | Admit: 2021-01-10 | Discharge: 2021-01-10 | Disposition: A | Discharge status: Home / Self Care | Payer: No Typology Code available for payment source | Attending: Emergency Medicine | Admitting: Emergency Medicine

## 2021-01-10 ENCOUNTER — Other Ambulatory Visit: Payer: Self-pay

## 2021-01-10 ENCOUNTER — Encounter (HOSPITAL_COMMUNITY): Payer: Self-pay | Admitting: Emergency Medicine

## 2021-01-10 DIAGNOSIS — H109 Unspecified conjunctivitis: Secondary | ICD-10-CM

## 2021-01-10 DIAGNOSIS — H1032 Unspecified acute conjunctivitis, left eye: Secondary | ICD-10-CM | POA: Diagnosis not present

## 2021-01-10 DIAGNOSIS — H5710 Ocular pain, unspecified eye: Secondary | ICD-10-CM | POA: Diagnosis present

## 2021-01-10 DIAGNOSIS — Z8616 Personal history of COVID-19: Secondary | ICD-10-CM | POA: Diagnosis not present

## 2021-01-10 MED ORDER — FLUORESCEIN SODIUM 1 MG OP STRP
1.0000 | ORAL_STRIP | Freq: Once | OPHTHALMIC | Status: AC
  Administered 2021-01-10: 1 via OPHTHALMIC
  Filled 2021-01-10: qty 1

## 2021-01-10 MED ORDER — SULFACETAMIDE SODIUM 10 % OP SOLN
1.0000 [drp] | OPHTHALMIC | Status: DC
  Administered 2021-01-10: 1 [drp] via OPHTHALMIC
  Filled 2021-01-10: qty 15

## 2021-01-10 MED ORDER — TETRACAINE HCL 0.5 % OP SOLN
2.0000 [drp] | Freq: Once | OPHTHALMIC | Status: AC
  Administered 2021-01-10: 2 [drp] via OPHTHALMIC
  Filled 2021-01-10: qty 4

## 2021-01-10 NOTE — ED Notes (Signed)
Performed VAT with pt wearing glasses he wears at all times

## 2021-01-10 NOTE — ED Provider Notes (Signed)
[facility] EMERGENCY DEPARTMENT Provider Note   CSN: [id number] Arrival date & time: 01/10/21  1712     History Chief Complaint  Patient presents with   Eye Pain    Dylan Shepard is a 24 y.o. male.   Eye Pain Pertinent negatives include no chest pain, no abdominal pain and no shortness of breath. Patient presents with eye pain and redness.  Began around 2 weeks ago when he developed COVID disease.  States it seems a little blurry.  There is some dull pain that will time get sharp.  Vision is remained somewhat blurry over the last 2 weeks.  Has had redness and some drainage.  Wears glasses but otherwise has not had eye problems.  No fevers.  The light bothers him a little.  No pain with moving the eye.  There is pain with pressing on the eye.     Past Medical History:  Diagnosis Date   Seizures [facility])     Patient Active Problem List   Diagnosis Date Noted   Generalized idiopathic epilepsy and epileptic syndromes, not intractable, without status epilepticus (HCC) 08/24/2020   Localization-related idiopathic epilepsy and epileptic syndromes with seizures of localized onset, not intractable, with status epilepticus (HCC) 08/24/2020    History reviewed. No pertinent surgical history.     Family History  Problem Relation Age of Onset   Hypertension Mother    Diabetes Mother    Hypertension Father    Diabetes Father     Social History   Tobacco Use   Smoking status: Never   Smokeless tobacco: Never  Substance Use Topics   Alcohol use: Yes    Comment: occassionally   Drug use: Never    Home Medications Prior to Admission medications   Medication Sig Start Date End Date Taking? Authorizing Provider  lacosamide (VIMPAT) 50 MG TABS tablet Take 1 tablet (50 mg total) by mouth 2 (two) times daily. 06/22/20   Jacalyn Lefevre, MD  levETIRAcetam (KEPPRA) 750 MG tablet Take 2 tablets (1,500 mg total) by mouth 2 (two) times daily. 08/24/20   Micki Riley, MD   ondansetron (ZOFRAN ODT) 8 MG disintegrating tablet Take 1 tablet (8 mg total) by mouth every 8 (eight) hours as needed for nausea or vomiting. 02/09/17   Azalia Bilis, MD  predniSONE (DELTASONE) 20 MG tablet Take 2 tablets (40 mg total) by mouth daily with breakfast. 12/13/20   Bing Neighbors, FNP  promethazine-dextromethorphan (PROMETHAZINE-DM) 6.25-15 MG/5ML syrup Take 5 mLs by mouth 4 (four) times daily as needed for cough. 12/13/20   Bing Neighbors, FNP    Allergies    Sudafed [pseudoephedrine]  Review of Systems   Review of Systems  Constitutional:  Negative for appetite change.  HENT:  Negative for congestion.   Eyes:  Positive for pain, discharge and redness.       Clear eye drainage.  Respiratory:  Negative for shortness of breath.   Cardiovascular:  Negative for chest pain.  Gastrointestinal:  Negative for abdominal pain.  Musculoskeletal:  Negative for back pain.  Skin:  Negative for rash.  Neurological:  Negative for weakness.   Physical Exam Updated Vital Signs BP 128/71 (BP Location: Right Arm)   Pulse 97   Temp 98.4 F (36.9 C) (Oral)   Resp 16   SpO2 98%   Physical Exam Vitals and nursing note reviewed.  Eyes:     Extraocular Movements: Extraocular movements intact.     Pupils: Pupils are equal, round,  and reactive to light.     Comments: Conjunctival injection on left.  Cornea clear.  Mild tenderness to palpation.  No proptosis.  Intraocular pressure of 15.  No corneal uptake on fluorescein exam.  Cardiovascular:     Rate and Rhythm: Regular rhythm.  Musculoskeletal:     Cervical back: Neck supple.  Skin:    Capillary Refill: Capillary refill takes less than 2 seconds.  Neurological:     Mental Status: He is alert and oriented to person, place, and time.    ED Results / Procedures / Treatments   Labs (all labs ordered are listed, but only abnormal results are displayed) Labs Reviewed - No data to display  EKG None  Radiology No results  found.  Procedures Procedures   Medications Ordered in ED Medications  sulfacetamide (BLEPH-10) 10 % ophthalmic solution 1 drop (has no administration in time range)  fluorescein ophthalmic strip 1 strip (1 strip Left Eye Given by Other 01/10/21 1843)  tetracaine (PONTOCAINE) 0.5 % ophthalmic solution 2 drop (2 drops Left Eye Given by Other 01/10/21 1858)    ED Course  I have reviewed the triage vital signs and the nursing notes.  Pertinent labs & imaging results that were available during my care of the patient were reviewed by me and considered in my medical decision making (see chart for details).    MDM Rules/Calculators/A&P                          Patient with eye pain and conjunctival injection on left.  Some mildly blurred vision.  Has had for the last 2 weeks since getting COVID.  Intraocular pressure normal.  No pain relief with tetracaine and no fluorescein uptake.  Does have somewhat decreased vision on left but unsure of baseline.  Will have patient follow-up with ophthalmology.  Will treat with antibiotic drops.  Doubt central retinal artery or vein occlusion.  Symptoms have been going for 2 weeks.  Outpatient follow-up. Final Clinical Impression(s) / ED Diagnoses Final diagnoses:  Conjunctivitis of left eye, unspecified conjunctivitis type    Rx / DC Orders ED Discharge Orders     None        Benjiman Core, MD 01/10/21 2358

## 2021-01-10 NOTE — ED Provider Notes (Signed)
Emergency Medicine Provider Triage Evaluation Note  Otniel Hoe , a 25 y.o. male  was evaluated in triage.  Pt complains of eye redness, decreased vision and eye discomfort x2 weeks after having covid.  Review of Systems  Positive: eye redness, decreased vision and eye discomfort  Negative: nv  Physical Exam  There were no vitals taken for this visit. Gen:   Awake, no distress   Resp:  Normal effort  MSK:   Moves extremities without difficulty  Other:  Conjunctiva is injected, perrl, eoms intact and without discomfort  Medical Decision Making  Medically screening exam initiated at 5:24 PM.  Appropriate orders placed.  Benjaman Pott was informed that the remainder of the evaluation will be completed by another provider, this initial triage assessment does not replace that evaluation, and the importance of remaining in the ED until their evaluation is complete.     Rayne Du 01/10/21 1725    Gerhard Munch, MD 01/10/21 [phone]

## 2021-01-10 NOTE — Discharge Instructions (Addendum)
Follow-up with ophthalmology.  They can do a more complete exam than I am able to do here.  Take the antibiotic drops and see if it will help.

## 2021-01-10 NOTE — ED Triage Notes (Signed)
Pt had COVID 2 weeks ago and c/o L eye irritation since then with "film" over L eye.

## 2021-02-01 ENCOUNTER — Ambulatory Visit: Payer: PRIVATE HEALTH INSURANCE | Admitting: Adult Health

## 2021-08-09 ENCOUNTER — Ambulatory Visit: Payer: No Typology Code available for payment source | Admitting: Adult Health

## 2021-08-09 ENCOUNTER — Encounter: Payer: Self-pay | Admitting: Adult Health

## 2021-08-09 VITALS — BP 130/82 | HR 82 | Ht 64.0 in | Wt 232.0 lb

## 2021-08-09 DIAGNOSIS — R569 Unspecified convulsions: Secondary | ICD-10-CM | POA: Diagnosis not present

## 2021-08-09 DIAGNOSIS — R519 Headache, unspecified: Secondary | ICD-10-CM | POA: Diagnosis not present

## 2021-08-09 DIAGNOSIS — R404 Transient alteration of awareness: Secondary | ICD-10-CM

## 2021-08-09 DIAGNOSIS — G40309 Generalized idiopathic epilepsy and epileptic syndromes, not intractable, without status epilepticus: Secondary | ICD-10-CM

## 2021-08-09 DIAGNOSIS — Z5181 Encounter for therapeutic drug level monitoring: Secondary | ICD-10-CM

## 2021-08-09 MED ORDER — TOPIRAMATE 25 MG PO TABS: 25.0000 mg | ORAL_TABLET | Freq: Two times a day (BID) | ORAL | 5 refills | Status: DC

## 2021-08-09 NOTE — Progress Notes (Signed)
[facility] [address] [place]. [place] [address] [phone]       OFFICE FOLLOW UP NOTE  Dylan Shepard Date of Birth:  06-Oct-1995 Medical Record Number:  [id number]    Primary neurologist: Dr. Pearlean Shepard Referring MD: Dylan Shepard  Reason for referral: Seizures Reason for visit: starring off episodes and headaches   Chief Complaint  Patient presents with   Seizures    Rm 2 FU  "having frequent migraines, staring spells"      HPI:   Update 08/09/2021 JM: Returns for follow-up visit per patient request after prior initial consult visit with Dr. Pearlean Shepard almost 26 year ago (although recommended 56-month follow-up).    C/o headaches started beginning of December - occipital area.  Tightness/pressure sensation.  Can be associated with photophobia, phonophobia and N/V.  Initially occurring daily but gradually improving over the past couple of weeks - reports 3 headaches past 2 weeks. No prior hx of migraines or headaches.  Not associated with visual changes. At times, can be debilitating. Will use Tylenol or ibuprofen occasionally with some benefit but will quickly return. Denies neck pain or stiffness. Denies fatigue or issues with sleeping.   Since November, reports being told he is having "starring episodes". Unsure how long these last for. Will feel disoriented after but quickly return back to baseline. Is unaware of this occurring and no symptoms prior. Reports being told of these events 2-3x per week. Hasn't had any events since beginning of this month. These have never happened before. Typical seizures generalized tonic-clonic.   Does endorse increased stressors in November and December, working at [employer] during holiday season with increased hours. His hours have since been reduced since beginning of this month.  Reports compliance on Keppra 1500 mg twice daily -denies side effects. Denies any other factors such as any medication changes around onset or illness. He  does not have an established PCP    MRI BRAIN w/wo contrast 09/10/2020 IMPRESSION:  Normal MRI brain (with and without).    EEG  09/19/2020 Summary  Normal electroencephalogram, awake, asleep and with activation procedures. There are no focal lateralizing or epileptiform features.   04/15/2020 (completed at [facility] neurology) abnormal awake and asleep EEG due to the presence of phase reversing sharp waves over the left temporal region.  This is felt to be consistent with a focal area of neuronal dysfunction with epileptogenic potential over the left temporal area.  No electrographic seizures were seen.      History provided for reference purposes only Consult visit 08/24/2020 Dr. Pearlean Shepard: [name] is a 26 year Shepard African-American male seen today for initial consultation visit for seizures.  He is accompanied by his mother.  History is obtained from them and review of recent electronic medical records from ER visit.  Patient states he has history of seizure disorder since age 26 when he was in seventh grade.  Seizures are usually generalized tonic-clonic without any aura.  He is unconscious.  Usually is out for about 5 minutes then wakes up disoriented confused and is tired for the rest of the day.  Denies specific aura or triggers except running out of medications for being under stress.  He has been on Keppra before and was on 1250 mg twice daily for 6 years when the dose was increased 2 months ago to 1500 twice daily as he had a couple of breakthrough seizures.  His seizure frequency has been barely 1 or 2/year until 2015 and when he went to college  his seizure frequency went up and this was related to stress and hence the dose of Keppra was increased from 500 twice daily to 1250 twice daily.  After he left college is done well and did not have any breakthrough seizures until October December last year when he had 3 or 4 seizures in that.  Patient attribute this to his altered sleep pattern since he  was working some jobs where he had to work night shifts and did not get reliable sleep.  Patient ran out of seizure medications and went to the ER on 06/22/2020 and the ER physician spoke to neurologist on-call who recommended adding Vimpat 50 mg twice daily to his Keppra but patient has not filled that prescription and remains on Keppra alone.  He has had no more seizures since the ER visit 2 months ago.  The patient's mother informs me that patient probably had some perinatal hypoxia is she needed him emergency C-section since patient was stuck in the birth canal during her delivery.  Patient has had decreased size of his right eye and face but is not sure if he has decreased size of the right arm or leg he has fairly good strength.  He has not had any intellectual or milestone delays.  Patient was followed at [facility] neurology in [place] and he has recently taken up a job in [place] and moved here but I do not have any of those records.  Patient has shown me EEG report from [facility] in [facility] which he has on his cell phone from 04/21/2020 which showed left temporal sharp waves with phase reversal at T3   ROS:   14 system review of systems is positive for those listed in HPI and all other systems negative    PMH:  Past Medical History:  Diagnosis Date   Migraines    Seizures (HCC)     Social History:  Social History   Socioeconomic History   Marital status: Single    Spouse name: Not on file   Number of children: 0   Years of education: Not on file   Highest education level: Bachelor's degree (e.g., BA, AB, BS)  Occupational History   Occupation: part time x2 jobs  Tobacco Use   Smoking status: Never   Smokeless tobacco: Never  Substance and Sexual Activity   Alcohol use: Yes    Comment: occassionally   Drug use: Never   Sexual activity: Not on file  Other Topics Concern   Not on file  Social History Narrative   Lives with roommate    Right Handed   No caffeine   Social Determinants of Health   Financial Resource Strain: Not on file  Food Insecurity: Not on file  Transportation Needs: Not on file  Physical Activity: Not on file  Stress: Not on file  Social Connections: Not on file  Intimate Partner Violence: Not on file    Medications:   Current Outpatient Medications on File Prior to Visit  Medication Sig Dispense Refill   levETIRAcetam (KEPPRA) 750 MG tablet Take 2 tablets (1,500 mg total) by mouth 2 (two) times daily. 120 tablet 11   lacosamide (VIMPAT) 50 MG TABS tablet Take 1 tablet (50 mg total) by mouth 2 (two) times daily. (Patient not taking: Reported on 08/09/2021) 60 tablet 0   No current facility-administered medications on file prior to visit.    Allergies:   Allergies  Allergen Reactions   Sudafed [Pseudoephedrine]  Not allergic, just interacts with Keppra    Physical Exam Today's Vitals   08/09/21 0810  BP: 130/82  Pulse: 82  Weight: 232 lb (105.2 kg)  Height: 5\' 4"  (1.626 m)   Body mass index is 39.82 kg/m.   General: Obese very pleasant young African-American male, seated, in no evident distress Head: head normocephalic and atraumatic.   Neck: supple with no carotid or supraclavicular bruits Cardiovascular: regular rate and rhythm, no murmurs Musculoskeletal: no deformity Skin:  no rash/petichiae Vascular:  Normal pulses all extremities  Neurologic Exam Mental Status: Awake and fully alert. Oriented to place and time. Recent and remote memory intact. Attention span, concentration and fund of knowledge appropriate. Mood and affect appropriate.  Cranial Nerves: Right eye is small in caliber with decreased palpebral fissure. Pupils equal, briskly reactive to light. Extraocular movements full without nystagmus. Visual fields full to confrontation. Hearing intact.  Very subtle hemiatrophy of the right face compared to the left (chronic).  Sensation intact. Face, tongue, palate moves  normally and symmetrically.  Motor: Normal bulk and tone. Normal strength in all tested extremity muscles.  Mild right grip weakness. Sensory.: intact to touch , pinprick , position and vibratory sensation.  Coordination: Rapid alternating movements normal in all extremities. Finger-to-nose and heel-to-shin performed accurately bilaterally. Gait and Station: Arises from chair without difficulty. Stance is normal. Gait demonstrates normal stride length and balance . Able to heel, toe and tandem walk without difficulty.  Reflexes: 1+ and symmetric. Toes downgoing.       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 MR brain 08/2020 unremarkable. EEG 09/2020 unremarkable. C/o new onset headaches and starring off episodes since Nov/Dec likely in setting of increased stressors with increased working hours at [employer] during holiday season.     1.  Seizures -now having starring off spells likely focal type seizure -repeat EEG -start topiramate 25 mg twice daily - discussed potential side effects, call after 1-2 weeks if no benefit or sooner if difficulty tolerating -Continue Keppra 1500 mg twice daily -obtain CBC with diff and CMP -Avoid seizure triggers such as stress, sleep deprivation, medication noncompliance, drastic change in dietary habits, etc -No driving for 6 months after seizure activity per [place] law   2.  Headaches -seems more tension related  -no red flags or concerning findings on exam -start topiramate 25 mg twice daily - discussed potential side effects, call after 1-2 weeks if no benefit or sooner if difficulty tolerating    Follow-up in 4 months or call earlier if needed    CC:  [facility] provider: Dr. Pearlean BrownieSethi   I spent 34  minutes of face-to-face and non-face-to-face time with patient.  This included previsit chart review, lab review, study review, order entry, electronic health record documentation, patient education and discussion regarding new complaints of focal type seizure and headaches possibly in setting of increased stressors, further evaluation and treatment options, history of grand mal seizures and ongoing use of antiseizure medication and answered all other questions to patient satisfaction  Ihor AustinJessica McCue, AGNP-BC  [facility] [address] [place], [place] [id number]  Phone [phone] Fax [phone] Note: This document was prepared with digital dictation and possible smart phrase technology. Any transcriptional errors that result from this process are unintentional.

## 2021-08-09 NOTE — Patient Instructions (Addendum)
Your Plan:  Continue keppra 1500mg  twice daily  Start topamax 25mg  twice daily for both headache and seizure prevention  We will repeat an EEG  No driving for 6 months after seizure activity per [place] law     Follow up in 4 months or call earlier if needed     Thank you for coming to see at [facility]. I hope we have been able to provide you high quality care today.  You may receive a patient satisfaction survey over the next few weeks. We would appreciate your feedback and comments so that we may continue to improve ourselves and the health of our patients.

## 2021-08-10 LAB — COMPREHENSIVE METABOLIC PANEL
ALT: 23 IU/L (ref 0–44)
AST: 32 IU/L (ref 0–40)
Albumin/Globulin Ratio: 1.6 (ref 1.2–2.2)
Albumin: 4.1 g/dL (ref 4.1–5.2)
Alkaline Phosphatase: 68 IU/L (ref 44–121)
BUN/Creatinine Ratio: 11 (ref 9–20)
BUN: 12 mg/dL (ref 6–20)
Bilirubin Total: 0.3 mg/dL (ref 0.0–1.2)
CO2: 24 mmol/L (ref 20–29)
Calcium: 8.9 mg/dL (ref 8.7–10.2)
Chloride: 107 mmol/L — ABNORMAL HIGH (ref 96–106)
Creatinine, Ser: 1.1 mg/dL (ref 0.76–1.27)
Globulin, Total: 2.6 g/dL (ref 1.5–4.5)
Glucose: 105 mg/dL — ABNORMAL HIGH (ref 70–99)
Potassium: 4.6 mmol/L (ref 3.5–5.2)
Sodium: 142 mmol/L (ref 134–144)
Total Protein: 6.7 g/dL (ref 6.0–8.5)
eGFR: 96 mL/min/{1.73_m2} (ref >59)

## 2021-08-10 LAB — CBC WITH DIFFERENTIAL/PLATELET
Basophils Absolute: 0.1 10*3/uL (ref 0.0–0.2)
Basos: 1 %
EOS (ABSOLUTE): 0.1 10*3/uL (ref 0.0–0.4)
Eos: 1 %
Hematocrit: 42.6 % (ref 37.5–51.0)
Hemoglobin: 13.6 g/dL (ref 13.0–17.7)
Immature Grans (Abs): 0 10*3/uL (ref 0.0–0.1)
Immature Granulocytes: 0 %
Lymphocytes Absolute: 1.5 10*3/uL (ref 0.7–3.1)
Lymphs: 30 %
MCH: 27.8 pg (ref 26.6–33.0)
MCHC: 31.9 g/dL (ref 31.5–35.7)
MCV: 87 fL (ref 79–97)
Monocytes Absolute: 0.3 10*3/uL (ref 0.1–0.9)
Monocytes: 6 %
Neutrophils Absolute: 3.1 10*3/uL (ref 1.4–7.0)
Neutrophils: 62 %
Platelets: 304 10*3/uL (ref 150–450)
RBC: 4.89 x10E6/uL (ref 4.14–5.80)
RDW: 13 % (ref 11.6–15.4)
WBC: 5.1 10*3/uL (ref 3.4–10.8)

## 2021-08-15 ENCOUNTER — Other Ambulatory Visit: Payer: No Typology Code available for payment source | Admitting: *Deleted

## 2021-08-21 IMAGING — CT CT HEAD W/O CM
4 series · 16 of 47 positions shown, 18 images · non-contrast
Comparison: None.

CLINICAL DATA: Seizure. Fell. Hit head.

EXAM:
CT HEAD WITHOUT CONTRAST
TECHNIQUE: Contiguous axial images were obtained from the base of the skull
through the vertex without intravenous contrast.

[Series 3: head without · axial · non-contrast · 0.46mm/px · z∈[-140,-20]mm · 7 of 34 slices shown, 9 images]
[im 5/34  brain]
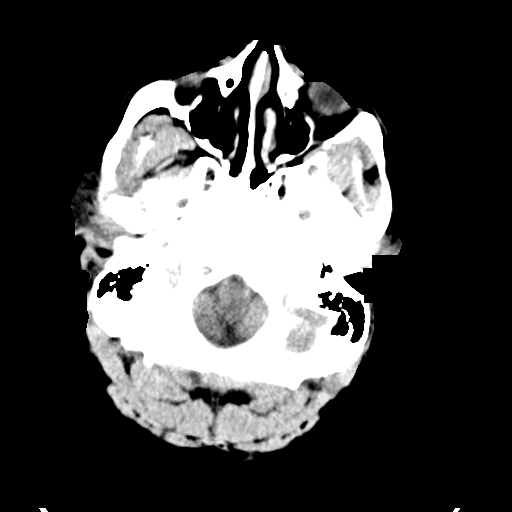
[im 5/34  bone]
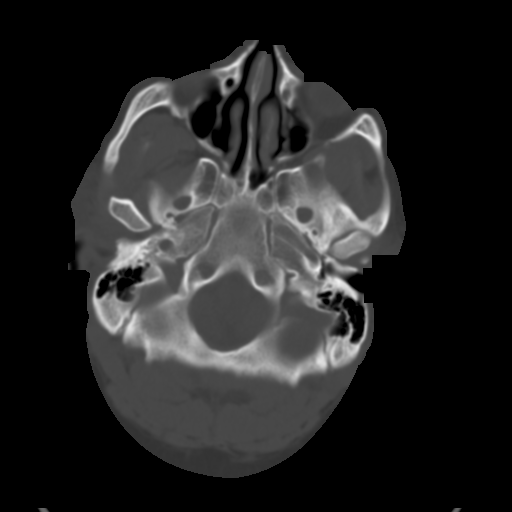
[im 9/34  brain]
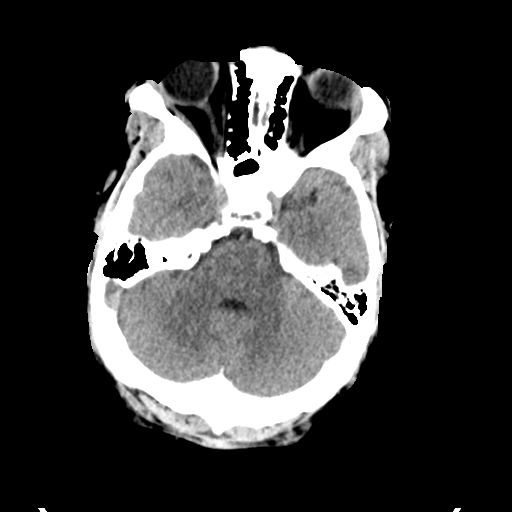
[im 13/34  brain]
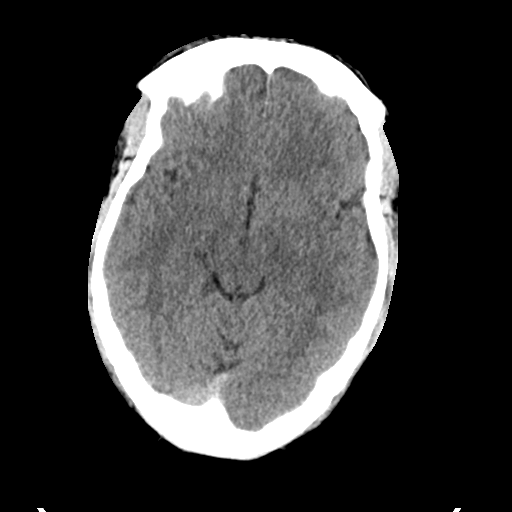
[im 17/34  brain]
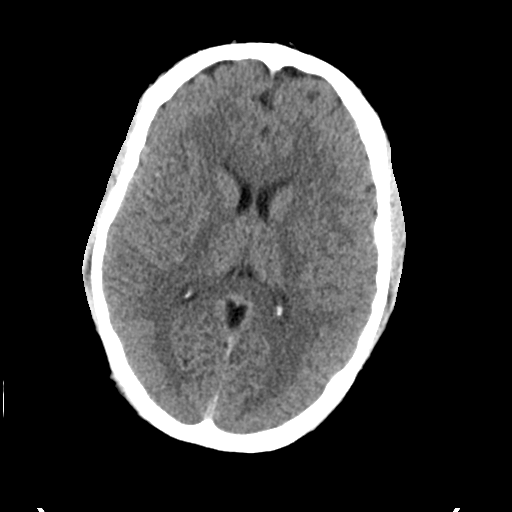
[im 21/34  brain]
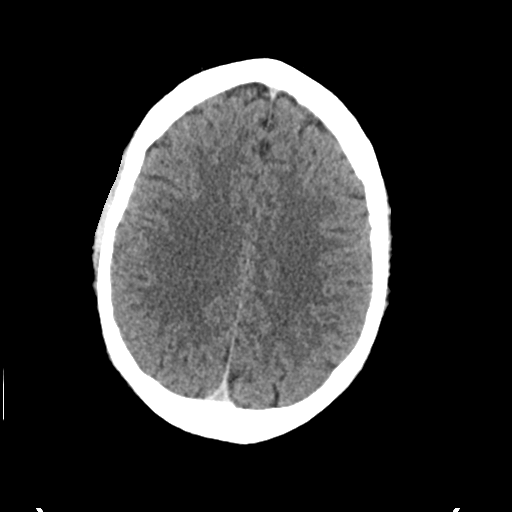
[im 21/34  bone]
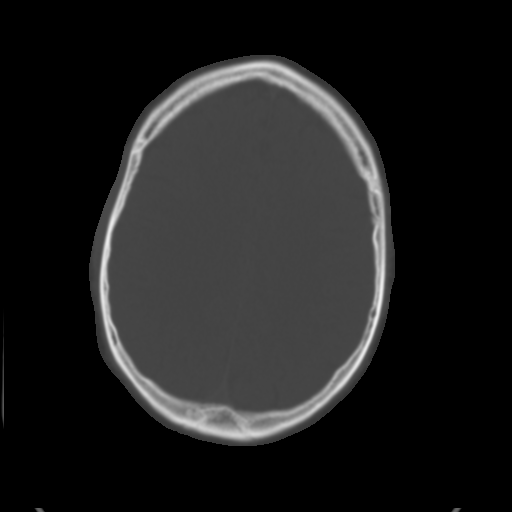
[im 25/34  brain]
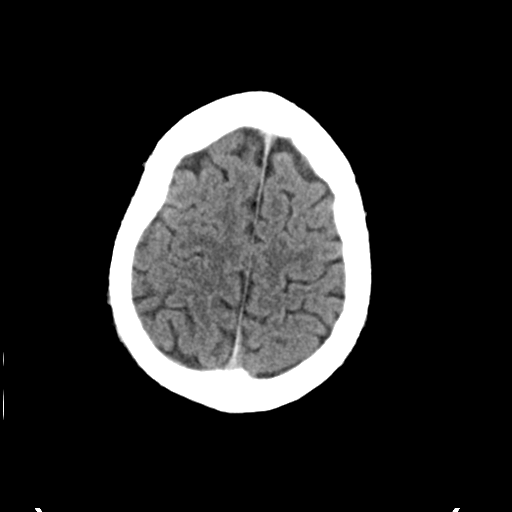
[im 29/34  brain]
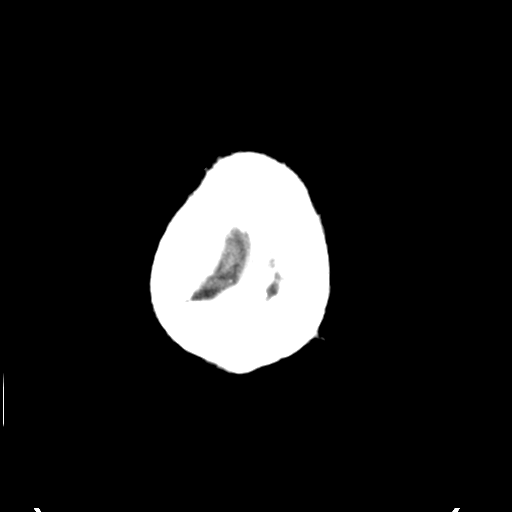

[Series 4: head bone · axial · 0.46mm/px · z∈[-144,-110]mm · 3 of 85 slices shown]
[im 9/85  bone]
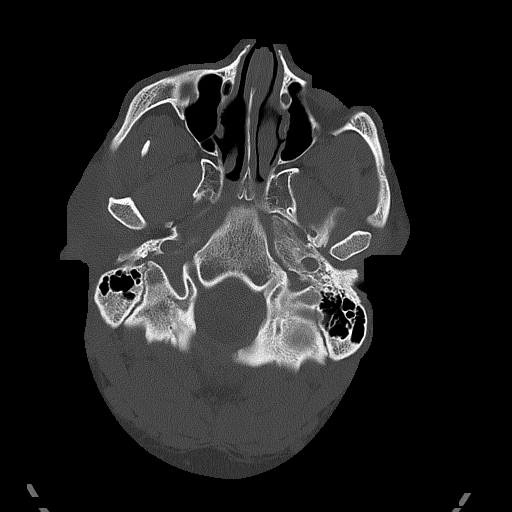
[im 17/85  bone]
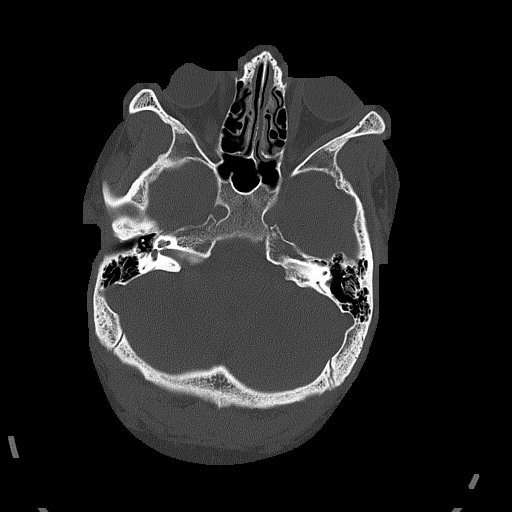
[im 26/85  bone]
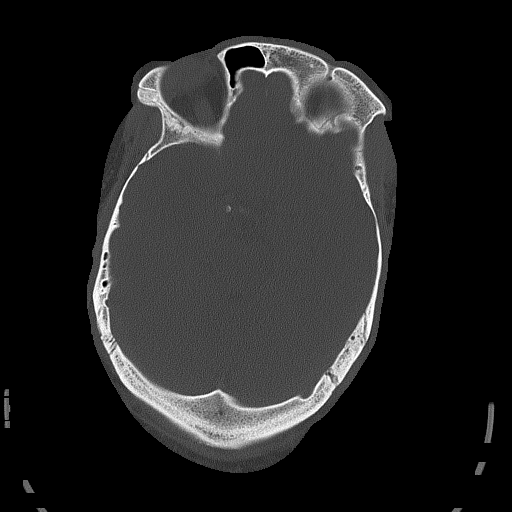

[Series 5: head without cor · coronal · non-contrast · 0.33mm/px · 3 of 73 slices shown]
[im 25/73  brain]
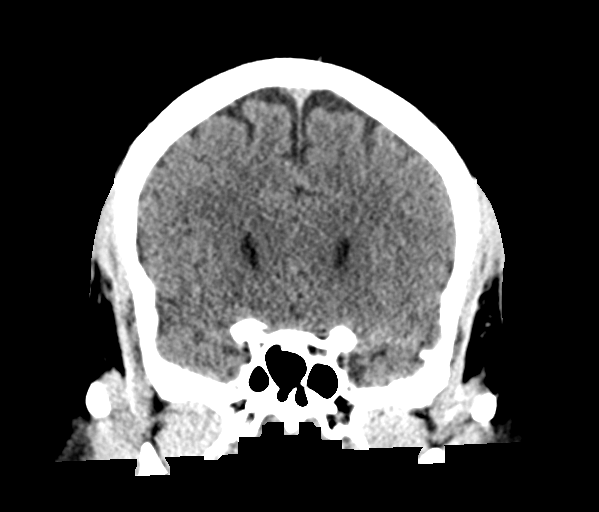
[im 33/73  brain]
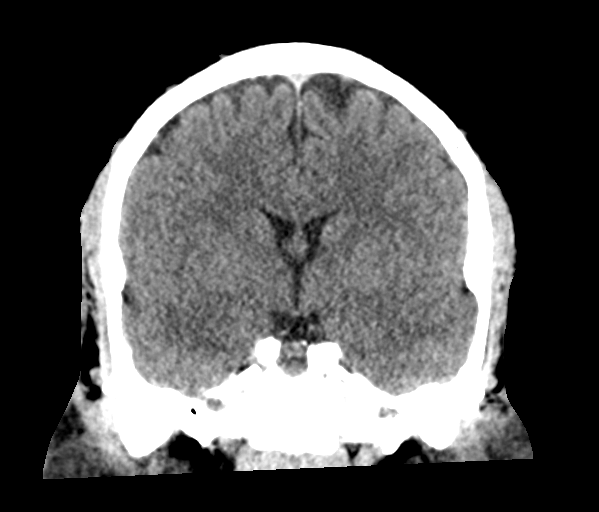
[im 41/73  brain]
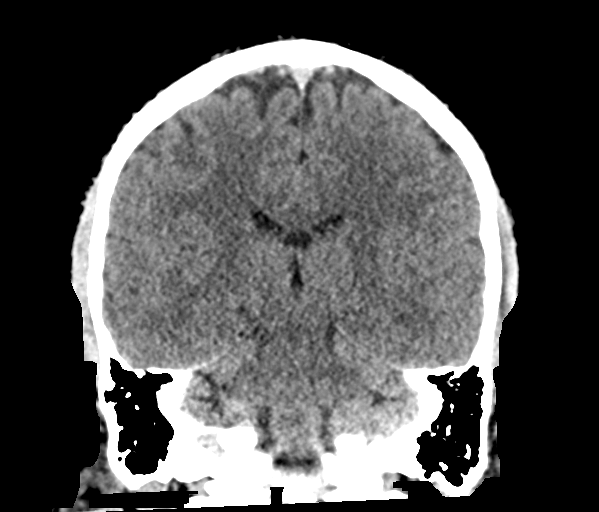

[Series 6: head without sag · sagittal · non-contrast · 0.34mm/px · 3 of 64 slices shown]
[im 22/64  brain]
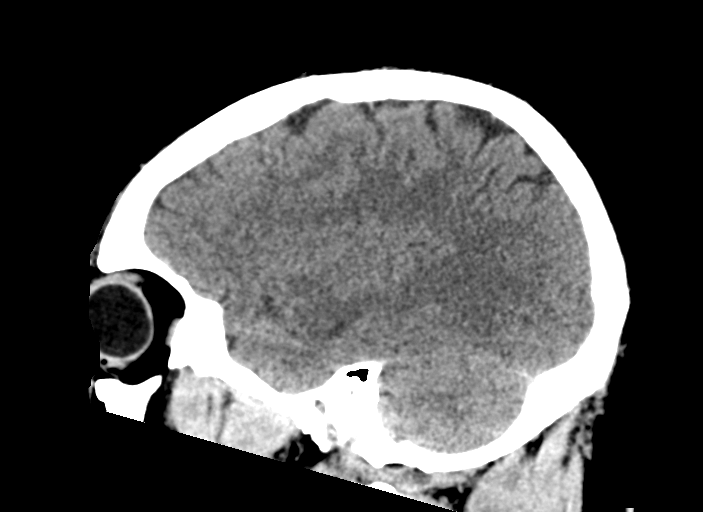
[im 32/64  brain]
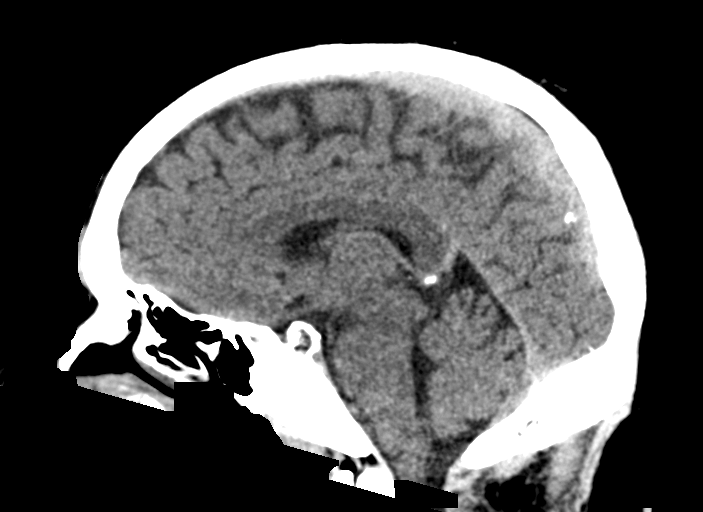
[im 43/64  brain]
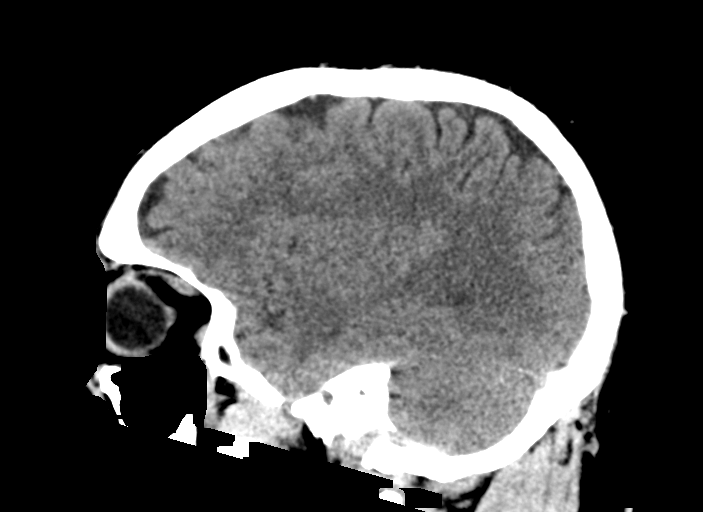

[16 of 47 positions shown; findings below may reference images not displayed]

FINDINGS: Brain: The ventricles are normal in size and configuration. No
extra-axial fluid collections are identified. The gray-white
differentiation is maintained. No CT findings for acute hemispheric
infarction or intracranial hemorrhage. No mass lesions. The
brainstem and cerebellum are normal.

Vascular: No hyperdense vessels or obvious aneurysm.

Skull: No acute skull fracture. No bone lesion.

Sinuses/Orbits: The paranasal sinuses and mastoid air cells are
clear. The globes are intact.

Other: No scalp lesions, laceration or hematoma. Incidental
incomplete anterior arch of C1 noted.
IMPRESSION: Normal head CT.

## 2021-08-22 ENCOUNTER — Ambulatory Visit: Payer: No Typology Code available for payment source | Admitting: Neurology

## 2021-08-22 ENCOUNTER — Other Ambulatory Visit: Payer: Self-pay

## 2021-08-22 DIAGNOSIS — R519 Headache, unspecified: Secondary | ICD-10-CM

## 2021-08-22 DIAGNOSIS — R569 Unspecified convulsions: Secondary | ICD-10-CM

## 2021-08-22 DIAGNOSIS — R404 Transient alteration of awareness: Secondary | ICD-10-CM

## 2021-10-17 ENCOUNTER — Encounter: Payer: Self-pay | Admitting: Adult Health

## 2021-10-17 NOTE — Telephone Encounter (Signed)
Okay to provide letter. He should be on 1st shift only and should not be working rotating shifts as altered sleep cycles can increase risk of seizures. Thank you.  ?

## 2021-10-17 NOTE — Telephone Encounter (Signed)
Are you willing to provide letter? ?

## 2021-11-01 ENCOUNTER — Other Ambulatory Visit: Payer: Self-pay | Admitting: *Deleted

## 2021-11-01 ENCOUNTER — Encounter: Payer: Self-pay | Admitting: Adult Health

## 2021-11-01 MED ORDER — LEVETIRACETAM 750 MG PO TABS: 1500.0000 mg | ORAL_TABLET | Freq: Two times a day (BID) | ORAL | 3 refills | Status: DC

## 2021-12-07 ENCOUNTER — Ambulatory Visit: Payer: No Typology Code available for payment source | Admitting: Adult Health

## 2021-12-27 ENCOUNTER — Encounter: Payer: Self-pay | Admitting: Adult Health

## 2022-01-01 ENCOUNTER — Ambulatory Visit: Payer: No Typology Code available for payment source | Admitting: Adult Health

## 2022-03-13 ENCOUNTER — Encounter: Payer: Self-pay | Admitting: Adult Health

## 2023-05-06 ENCOUNTER — Other Ambulatory Visit (HOSPITAL_BASED_OUTPATIENT_CLINIC_OR_DEPARTMENT_OTHER): Payer: Self-pay

## 2023-05-06 ENCOUNTER — Other Ambulatory Visit: Payer: Self-pay

## 2023-05-06 ENCOUNTER — Emergency Department (HOSPITAL_BASED_OUTPATIENT_CLINIC_OR_DEPARTMENT_OTHER)
Admission: EM | Admit: 2023-05-06 | Discharge: 2023-05-06 | Disposition: A | Discharge status: Home / Self Care | Payer: Medicaid Other | Attending: Emergency Medicine | Admitting: Emergency Medicine

## 2023-05-06 ENCOUNTER — Encounter (HOSPITAL_BASED_OUTPATIENT_CLINIC_OR_DEPARTMENT_OTHER): Payer: Self-pay | Admitting: Urology

## 2023-05-06 DIAGNOSIS — L03317 Cellulitis of buttock: Secondary | ICD-10-CM | POA: Diagnosis not present

## 2023-05-06 DIAGNOSIS — M545 Low back pain, unspecified: Secondary | ICD-10-CM | POA: Diagnosis present

## 2023-05-06 LAB — BASIC METABOLIC PANEL
Anion gap: 7 (ref 5–15)
BUN: 9 mg/dL (ref 6–20)
CO2: 27 mmol/L (ref 22–32)
Calcium: 9.2 mg/dL (ref 8.9–10.3)
Chloride: 100 mmol/L (ref 98–111)
Creatinine, Ser: 1.17 mg/dL (ref 0.61–1.24)
GFR, Estimated: >60 mL/min (ref >60)
Glucose, Bld: 105 mg/dL — ABNORMAL HIGH (ref 70–99)
Potassium: 3.8 mmol/L (ref 3.5–5.1)
Sodium: 134 mmol/L — ABNORMAL LOW (ref 135–145)

## 2023-05-06 LAB — CBC WITH DIFFERENTIAL/PLATELET
Abs Immature Granulocytes: 0.08 10*3/uL — ABNORMAL HIGH (ref 0.00–0.07)
Basophils Absolute: 0.1 10*3/uL (ref 0.0–0.1)
Basophils Relative: 1 %
Eosinophils Absolute: 0.1 10*3/uL (ref 0.0–0.5)
Eosinophils Relative: 0 %
HCT: 40 % (ref 39.0–52.0)
Hemoglobin: 12.6 g/dL — ABNORMAL LOW (ref 13.0–17.0)
Immature Granulocytes: 1 %
Lymphocytes Relative: 12 %
Lymphs Abs: 1.5 10*3/uL (ref 0.7–4.0)
MCH: 26.1 pg (ref 26.0–34.0)
MCHC: 31.5 g/dL (ref 30.0–36.0)
MCV: 82.8 fL (ref 80.0–100.0)
Monocytes Absolute: 0.8 10*3/uL (ref 0.1–1.0)
Monocytes Relative: 6 %
Neutro Abs: 10.2 10*3/uL — ABNORMAL HIGH (ref 1.7–7.7)
Neutrophils Relative %: 80 %
Platelets: 435 10*3/uL — ABNORMAL HIGH (ref 150–400)
RBC: 4.83 MIL/uL (ref 4.22–5.81)
RDW: 14.3 % (ref 11.5–15.5)
WBC: 12.8 10*3/uL — ABNORMAL HIGH (ref 4.0–10.5)
nRBC: 0 % (ref 0.0–0.2)

## 2023-05-06 MED ORDER — ACETAMINOPHEN 500 MG PO TABS
ORAL_TABLET | ORAL | Status: AC
  Filled 2023-05-06: qty 1

## 2023-05-06 MED ORDER — SULFAMETHOXAZOLE-TRIMETHOPRIM 800-160 MG PO TABS
1.0000 | ORAL_TABLET | Freq: Once | ORAL | Status: AC
  Administered 2023-05-06: 1 via ORAL
  Filled 2023-05-06: qty 1

## 2023-05-06 MED ORDER — CEPHALEXIN 500 MG PO CAPS
500.0000 mg | ORAL_CAPSULE | Freq: Four times a day (QID) | ORAL | 0 refills | Status: DC
  Filled 2023-05-06: qty 28, 7d supply, fill #0

## 2023-05-06 MED ORDER — CEFAZOLIN SODIUM-DEXTROSE 2-4 GM/100ML-% IV SOLN
2.0000 g | Freq: Once | INTRAVENOUS | Status: AC
  Administered 2023-05-06: 2 g via INTRAVENOUS
  Filled 2023-05-06: qty 100

## 2023-05-06 MED ORDER — ACETAMINOPHEN 500 MG PO TABS
1000.0000 mg | ORAL_TABLET | Freq: Once | ORAL | Status: AC
  Administered 2023-05-06: 1000 mg via ORAL
  Filled 2023-05-06: qty 2

## 2023-05-06 MED ORDER — SULFAMETHOXAZOLE-TRIMETHOPRIM 800-160 MG PO TABS
1.0000 | ORAL_TABLET | Freq: Two times a day (BID) | ORAL | 0 refills | Status: DC
  Filled 2023-05-06: qty 14, 7d supply, fill #0

## 2023-05-06 NOTE — ED Notes (Signed)
ED Provider at bedside. Provider with [place]

## 2023-05-06 NOTE — ED Triage Notes (Signed)
Pt sent here from pcp  For abscess to right buttocks  Started on antibiotics (doxycycline) x 10 days, took last dose yesterday  States was feeling better but has return of pain and now having fever

## 2023-05-06 NOTE — ED Provider Notes (Signed)
[facility] Provider Note  CSN: [id number] Arrival date & time: 05/06/23 1455  Chief Complaint(s) Abscess  HPI Dylan Shepard is a 27 y.o. male here today with pain on his right buttock.  Patient went to an urgent care for this 10 days ago, started on doxycycline, felt as though he is getting better, went to his PCP today after he started feel as though he had a fever, who recommended he come to the emergency department for further evaluation.  Patient with a history of seizure disorder, on Keppra.   Past Medical History Past Medical History:  Diagnosis Date   Migraines    Seizures [facility])    Patient Active Problem List   Diagnosis Date Noted   Generalized idiopathic epilepsy and epileptic syndromes, not intractable, without status epilepticus (HCC) 08/24/2020   Localization-related idiopathic epilepsy and epileptic syndromes with seizures of localized onset, not intractable, with status epilepticus (HCC) 08/24/2020   Home Medication(s) Prior to Admission medications   Medication Sig Start Date End Date Taking? Authorizing Provider  levETIRAcetam (KEPPRA) 750 MG tablet Take 2 tablets (1,500 mg total) by mouth 2 (two) times daily. 11/01/21   Ihor Austin, NP  topiramate (TOPAMAX) 25 MG tablet Take 1 tablet (25 mg total) by mouth 2 (two) times daily. 08/09/21   Ihor Austin, NP                                                                                                                                    Past Surgical History History reviewed. No pertinent surgical history. Family History Family History  Problem Relation Age of Onset   Hypertension Mother    Diabetes Mother    Hypertension Father    Diabetes Father     Social History Social History   Tobacco Use   Smoking status: Never   Smokeless tobacco: Never  Substance Use Topics   Alcohol use: Yes    Comment: occassionally   Drug use: Never   Allergies Sudafed  [pseudoephedrine]  Review of Systems Review of Systems  Physical Exam Vital Signs  I have reviewed the triage vital signs BP (!) 143/75 (BP Location: Right Arm)   Pulse (!) 106   Temp 100 F (37.8 C)   Resp 17   Ht 5\' 4"  (1.626 m)   Wt 105.2 kg   SpO2 100%   BMI 39.81 kg/m   Physical Exam Vitals reviewed.  Skin:    General: Skin is warm and dry.     Comments: Area of induration and erythema on the medial aspect of the right buttock.  There is no drainage, no pocket of fluctuance.     ED Results and Treatments Labs (all labs ordered are listed, but only abnormal results are displayed) Labs Reviewed  CBC WITH DIFFERENTIAL/PLATELET  BASIC METABOLIC PANEL  Radiology No results found.  Pertinent labs & imaging results that were available during my care of the patient were reviewed by me and considered in my medical decision making (see MDM for details).  Medications Ordered in ED Medications - No data to display                                                                                                                                   Procedures Ultrasound ED Soft Tissue  Date/Time: 05/06/2023 3:17 PM  Performed by: Arletha Pili, DO Authorized by: Arletha Pili, DO   Procedure details:    Indications: localization of abscess     Transverse view:  Visualized   Longitudinal view:  Not visualized   Images: archived   Location:    Location: buttocks     Side:  Right Findings:     no abscess present    cellulitis present Comments:     No appreciable abscess identified   (including critical care time)  Medical Decision Making / ED Course   This patient presents to the ED for concern of cellulitis, this involves an extensive number of treatment options, and is a complaint that carries with it a high risk of complications and  morbidity.  The differential diagnosis includes cellulitis, abscess, less likely sepsis, less likely bacteremia.  MDM: 27 year old male here today with pain, indurated area on the right buttock.  Appears to be a cellulitis, no appreciable abscess identified on ultrasound.  Patient is young, otherwise healthy.  He is not diabetic, has no immuno suppressive medications.  Patient's initial heart rate borderline tachycardic at 106.  Will provide him with some oral Tylenol, orally rehydrate.  The patient being young, otherwise healthy, do believe treating him with IV antibiotics, and followed by a change in his oral antibiotic coverage is reasonable to attempt before definitively stating the patient has failed outpatient therapy.  Patient has no issues with following up with PCP.  Have ordered CBC and BMP.   Additional history obtained:  -External records from outside source obtained and reviewed including: Chart review including previous notes, labs, imaging, consultation notes   Lab Tests: -I ordered, reviewed, and interpreted labs.   The pertinent results include: Mild leukocytosis, normal kidney function. Labs Reviewed  CBC WITH DIFFERENTIAL/PLATELET  BASIC METABOLIC PANEL      EKG   EKG Interpretation Date/Time:    Ventricular Rate:    PR Interval:    QRS Duration:    QT Interval:    QTC Calculation:   R Axis:      Text Interpretation:           Imaging Studies ordered: I ordered imaging studies including ultrasound imaging. I independently visualized and interpreted imaging.   Medicines ordered and prescription drug management: No orders of the defined types were placed in this encounter. Rocephin, Bactrim  -I have reviewed the patients home medicines and have  made adjustments as needed    Reevaluation: After the interventions noted above, I reevaluated the patient and found that they have :improved  Co morbidities that complicate the patient evaluation  Past  Medical History:  Diagnosis Date   Migraines    Seizures (HCC)       Dispostion: I considered admission for this patient, however believe he would benefit from change in his oral antibiotic regimen and reevaluation.  If patient's symptoms are not improved over the next couple of days, then admission for IV antibiotics would be warranted.     Final Clinical Impression(s) / ED Diagnoses Final diagnoses:  None     @PCDICTATION @    Anders Simmonds T, DO 05/06/23 1645

## 2023-05-06 NOTE — Discharge Instructions (Addendum)
I sent you 2 prescriptions for antibiotics.  The first is Keflex, which you will take 4 times per day for the next 1 week.  The second is Bactrim, which she will take 2 times per day for the next 1 week.  Follow-up with your primary care doctor within 2 to 3 days to have this area looked at.  If you develop worsening fever, feel increased pain, redness or feel as though the swelling is worsening, you should come back to the emergency department.  Typically you will begin to notice an improvement after 24 to 48 hours with these antibiotics.  You had labs done today that were normal.

## 2023-05-08 ENCOUNTER — Encounter (HOSPITAL_BASED_OUTPATIENT_CLINIC_OR_DEPARTMENT_OTHER): Payer: Self-pay | Admitting: Emergency Medicine

## 2023-05-08 ENCOUNTER — Other Ambulatory Visit (HOSPITAL_BASED_OUTPATIENT_CLINIC_OR_DEPARTMENT_OTHER): Payer: Self-pay

## 2023-05-08 ENCOUNTER — Emergency Department (HOSPITAL_BASED_OUTPATIENT_CLINIC_OR_DEPARTMENT_OTHER)
Admission: EM | Admit: 2023-05-08 | Discharge: 2023-05-08 | Disposition: A | Discharge status: Home / Self Care | Payer: Medicaid Other | Attending: Emergency Medicine | Admitting: Emergency Medicine

## 2023-05-08 DIAGNOSIS — L0291 Cutaneous abscess, unspecified: Secondary | ICD-10-CM

## 2023-05-08 DIAGNOSIS — L0231 Cutaneous abscess of buttock: Secondary | ICD-10-CM | POA: Diagnosis present

## 2023-05-08 MED ORDER — LIDOCAINE-EPINEPHRINE (PF) 2 %-1:200000 IJ SOLN
10.0000 mL | Freq: Once | INTRAMUSCULAR | Status: AC
  Administered 2023-05-08: 10 mL
  Filled 2023-05-08: qty 20

## 2023-05-08 MED ORDER — HYDROCODONE-ACETAMINOPHEN 5-325 MG PO TABS
1.0000 | ORAL_TABLET | Freq: Once | ORAL | Status: AC
  Administered 2023-05-08: 1 via ORAL
  Filled 2023-05-08: qty 1

## 2023-05-08 MED ORDER — CLINDAMYCIN HCL 300 MG PO CAPS
300.0000 mg | ORAL_CAPSULE | Freq: Four times a day (QID) | ORAL | 0 refills | Status: DC
  Filled 2023-05-08: qty 28, 7d supply, fill #0

## 2023-05-08 NOTE — ED Triage Notes (Signed)
Pt c/o abscess RT side buttocks, recent tx for same. Reports drainage and fever

## 2023-05-08 NOTE — ED Provider Notes (Signed)
[place] EMERGENCY DEPARTMENT AT [facility] Provider Note   CSN: [id number] Arrival date & time: 05/08/23  1024     History  Chief Complaint  Patient presents with   Abscess    Dylan Shepard is a 27 y.o. male.   Abscess   27 year old male presents emergency department with complaints of abscess.  Patient states that he has been having swollen and tender area in his right buttock for the past 12 to 13 days or so.  Was seen in urgent care when symptoms began and then the emergency department a couple of days ago for the same presentation.  Had bedside ultrasound performed 2 days ago which did not show evidence of abscess per prior provider.  Patient states that area has continued to cause pain.  Reports drainage last night.  Presents emergency department for drainage of abscess.  States that he has been having intermittent fever this fever seems to have broken over the past day or so.  Denies any extension of redness, tenderness down in perineal area/scrotal area.  Denies history of abscesses similar in the past.  Has been on Keflex as well as Bactrim in the outpatient setting since being seen 2 days ago.  Past medical history significant for seizure on Keppra.  Home Medications Prior to Admission medications   Medication Sig Start Date End Date Taking? Authorizing Provider  clindamycin (CLEOCIN) 300 MG capsule Take 1 capsule (300 mg total) by mouth 4 (four) times daily for 7 days. 05/08/23 05/15/23 Yes Sherian Maroon A, PA  levETIRAcetam (KEPPRA) 500 MG tablet Take 500 mg by mouth 2 (two) times daily. 04/07/20  Yes [provider]  levETIRAcetam (KEPPRA) 750 MG tablet Take 2 tablets (1,500 mg total) by mouth 2 (two) times daily. 11/01/21   Ihor Austin, NP  topiramate (TOPAMAX) 25 MG tablet Take 1 tablet (25 mg total) by mouth 2 (two) times daily. 08/09/21   Ihor Austin, NP      Allergies    Sudafed [pseudoephedrine]    Review of Systems   Review of Systems   All other systems reviewed and are negative.   Physical Exam Updated Vital Signs BP 136/69   Pulse (!) 102   Temp 99.5 F (37.5 C) (Oral)   Resp 20   Wt 97.1 kg   SpO2 96%   BMI 36.73 kg/m  Physical Exam Vitals and nursing note reviewed.  Constitutional:      General: He is not in acute distress.    Appearance: He is well-developed.  HENT:     Head: Normocephalic and atraumatic.  Eyes:     Conjunctiva/sclera: Conjunctivae normal.  Cardiovascular:     Rate and Rhythm: Normal rate and regular rhythm.     Heart sounds: No murmur heard. Pulmonary:     Effort: Pulmonary effort is normal. No respiratory distress.     Breath sounds: Normal breath sounds.  Abdominal:     Palpations: Abdomen is soft.     Tenderness: There is no abdominal tenderness.  Musculoskeletal:        General: No swelling.     Cervical back: Neck supple.  Skin:    General: Skin is warm and dry.     Capillary Refill: Capillary refill takes less than 2 seconds.     Comments: Large indurated area on patient's right buttock measuring 15 to 17 cm in diameter.  Palpable area of fluctuance centrally located with purulent drainage present.  No obvious extension of erythema, induration into perineal  area or scrotal area or rectal area.  Area warm and tender to the touch.  Neurological:     Mental Status: He is alert.  Psychiatric:        Mood and Affect: Mood normal.     ED Results / Procedures / Treatments   Labs (all labs ordered are listed, but only abnormal results are displayed) Labs Reviewed - No data to display  EKG None  Radiology No results found.  Procedures .Marland KitchenIncision and Drainage  Date/Time: 05/08/2023 1:26 PM  Performed by: Peter Garter, PA Authorized by: Peter Garter, PA   Consent:    Consent obtained:  Verbal   Consent given by:  Patient   Risks, benefits, and alternatives were discussed: yes     Risks discussed:  Bleeding, infection, damage to other organs,  incomplete drainage and pain   Alternatives discussed:  No treatment, delayed treatment, alternative treatment, referral and observation Universal protocol:    Procedure explained and questions answered to patient or proxy's satisfaction: yes     Patient identity confirmed:  Verbally with patient and arm band Location:    Type:  Abscess   Size:  7.5   Location:  Anogenital   Anogenital location: Right buttock. Pre-procedure details:    Skin preparation:  Povidone-iodine Sedation:    Sedation type:  None Anesthesia:    Anesthesia method:  Local infiltration   Local anesthetic:  Lidocaine 2% WITH epi Procedure type:    Complexity:  Complex Procedure details:    Ultrasound guidance: yes     Incision types:  Single straight   Wound management:  Probed and deloculated and irrigated with saline   Drainage:  Purulent and bloody   Drainage amount:  Copious   Wound treatment:  Drain placed   Packing materials:  1/4 in iodoform gauze   Amount 1/4" iodoform:  3' Post-procedure details:    Procedure completion:  Tolerated well, no immediate complications     Medications Ordered in ED Medications  HYDROcodone-acetaminophen (NORCO/VICODIN) 5-325 MG per tablet 1 tablet (1 tablet Oral Given 05/08/23 1104)  lidocaine-EPINEPHrine (XYLOCAINE W/EPI) 2 %-1:200000 (PF) injection 10 mL (10 mLs Infiltration Given by Other 05/08/23 1105)    ED Course/ Medical Decision Making/ A&P                                 Medical Decision Making Risk Prescription drug management.   This patient presents to the ED for concern of abscess, this involves an extensive number of treatment options, and is a complaint that carries with it a high risk of complications and morbidity.  The differential diagnosis includes cellulitis, abscess, necrotizing infection, sepsis, other   Co morbidities that complicate the patient evaluation  See HPI   Additional history obtained:  Additional history obtained from  EMR External records from outside source obtained and reviewed including hospital records   Lab Tests:  N/a   Imaging Studies ordered:  N/a   Cardiac Monitoring: / EKG:  The patient was maintained on a cardiac monitor.  I personally viewed and interpreted the cardiac monitored which showed an underlying rhythm of: Sinus rhythm   Consultations Obtained:  N/a   Problem List / ED Course / Critical interventions / Medication management  Abscess I ordered medication including Norco, lidocaine with epinephrine   Reevaluation of the patient after these medicines showed that the patient improved I have reviewed the patients home medicines  and have made adjustments as needed   Social Determinants of Health:  Denies tobacco, illicit drug use  Test / Admission - Considered:  Abscess Vitals signs significant for tachycardia with heart rate 102 initially of which decreased to within normal range with time elapsed while in the emergency department.. Otherwise within normal range and stable throughout visit. 27 year old male presents emergency department with complaints of continued right buttock pain.  On exam, patient with appreciable drainage as well as surrounding cellulitic skin changes as above.  Abscess drained in manner depicted above with copious amounts of purulent drainage appreciated during procedure.  Patient noted significant improvement of symptoms after area was drained.  Considered labs as well as imaging studies but given area already draining as well as copious amounts of drainage appreciated when I&D was performed, imaging/lab deemed unnecessary.  Patient has been without subjective fever for the past day or so with no worsening of redness, pain, swelling.  Hard to distinguish within on antibiotic therapy has failed given presence of abscess and only change of antibiotics 2 days ago.  Will recommend continued use of clindamycin in the outpatient setting as prescribed by  prior provider.  Will recommend strict return precautions if development of fever, worsening pain/redness/swelling or other worrisome symptoms we discussed.  Patient over well-appearing, afebrile in no acute distress. Worrisome signs and symptoms were discussed with the patient, and the patient acknowledged understanding to return to the ED if noticed. Patient was stable upon discharge.          Final Clinical Impression(s) / ED Diagnoses Final diagnoses:  Abscess    Rx / DC Orders ED Discharge Orders          Ordered    clindamycin (CLEOCIN) 300 MG capsule  4 times daily        05/08/23 1334              Peter Garter, [place] 05/08/23 1336    Blane Ohara, MD 05/13/23 [phone]

## 2023-05-08 NOTE — Discharge Instructions (Addendum)
As discussed, continue to take antibiotics in the form of clindamycin as prescribed from prior visit on 10/21.  Please return if you develop fever, worsening pain, worsening swelling.  Recommend warm compresses over your buttock region to help aid in drainage.  Please do not hesitate to return to emergency department if there are worrisome signs and symptoms we discussed become apparent.

## 2023-05-11 ENCOUNTER — Encounter (HOSPITAL_BASED_OUTPATIENT_CLINIC_OR_DEPARTMENT_OTHER): Payer: Self-pay | Admitting: Emergency Medicine

## 2023-05-11 ENCOUNTER — Emergency Department (HOSPITAL_BASED_OUTPATIENT_CLINIC_OR_DEPARTMENT_OTHER)
Admission: EM | Admit: 2023-05-11 | Discharge: 2023-05-12 | Disposition: A | Discharge status: Home / Self Care | Payer: Medicaid Other | Attending: Emergency Medicine | Admitting: Emergency Medicine

## 2023-05-11 DIAGNOSIS — L0231 Cutaneous abscess of buttock: Secondary | ICD-10-CM | POA: Diagnosis present

## 2023-05-11 MED ORDER — LIDOCAINE HCL 2 % IJ SOLN
10.0000 mL | Freq: Once | INTRAMUSCULAR | Status: AC
  Administered 2023-05-12: 200 mg
  Filled 2023-05-11: qty 20

## 2023-05-11 NOTE — ED Triage Notes (Signed)
PT to triage c/o pain to right buttock 7/10 ache in nature resulting from abscess. Pt reports he was seen Mon and Wed for I/D and RX antibiotics. Pt report he doesn't feel like the wound is getting better. VSS NAD PT on room air.

## 2023-05-12 MED ORDER — CLINDAMYCIN HCL 300 MG PO CAPS: 300.0000 mg | ORAL_CAPSULE | Freq: Four times a day (QID) | ORAL | 0 refills | Status: AC

## 2023-05-12 NOTE — ED Provider Notes (Signed)
[place] EMERGENCY DEPARTMENT AT [facility]  Provider Note  CSN: [id number] Arrival date & time: 05/11/23 1859  History Chief Complaint  Patient presents with   Wound Check    Dylan Shepard is a 27 y.o. male here for re-ealuation of buttock abscess, initially seen on 10/21 and had bedside US reportedly negative for abscess, given oral Abx but not improving. Seen again on 10/23 in follow up and had an abscess drained and Abx switched to clindamycin. He reports he continues to have pain, swelling and drainage to that area. No fevers. He has 2 days of clindamycin remaining.    Home Medications Prior to Admission medications   Medication Sig Start Date End Date Taking? Authorizing Provider  clindamycin (CLEOCIN) 300 MG capsule Take 1 capsule (300 mg total) by mouth 4 (four) times daily for 5 days. 05/12/23 05/17/23  Pollyann Savoy, MD  levETIRAcetam (KEPPRA) 500 MG tablet Take 500 mg by mouth 2 (two) times daily. 04/07/20   [provider]  levETIRAcetam (KEPPRA) 750 MG tablet Take 2 tablets (1,500 mg total) by mouth 2 (two) times daily. 11/01/21   Ihor Austin, NP  topiramate (TOPAMAX) 25 MG tablet Take 1 tablet (25 mg total) by mouth 2 (two) times daily. 08/09/21   Ihor Austin, NP     Allergies    Sudafed [pseudoephedrine]   Review of Systems   Review of Systems Please see HPI for pertinent positives and negatives  Physical Exam BP (!) 148/85 (BP Location: Right Arm)   Pulse 95   Temp 99.6 F (37.6 C) (Oral)   Resp 18   Wt 97.1 kg   SpO2 99%   BMI 36.73 kg/m   Physical Exam Vitals and nursing note reviewed.  HENT:     Head: Normocephalic.     Nose: Nose normal.  Eyes:     Extraocular Movements: Extraocular movements intact.  Pulmonary:     Effort: Pulmonary effort is normal.  Musculoskeletal:        General: Normal range of motion.     Cervical back: Neck supple.  Skin:    Findings: No rash (on exposed skin).     Comments: There is a  moderate area of erythema, induration and fluctuance superior to the area previously incised. Some spontaneous purulent drainage  Neurological:     Mental Status: He is alert and oriented to person, place, and time.  Psychiatric:        Mood and Affect: Mood normal.     ED Results / Procedures / Treatments   EKG None  Procedures .Marland KitchenIncision and Drainage  Date/Time: 05/12/2023 12:20 AM  Performed by: Pollyann Savoy, MD Authorized by: Pollyann Savoy, MD   Consent:    Consent obtained:  Verbal   Consent given by:  Patient Location:    Type:  Abscess   Location:  Anogenital   Anogenital location:  Gluteal cleft Pre-procedure details:    Skin preparation:  Povidone-iodine Anesthesia:    Anesthesia method:  Local infiltration   Local anesthetic:  Lidocaine 2% w/o epi Procedure type:    Complexity:  Complex Procedure details:    Incision types:  Single straight   Wound management:  Probed and deloculated   Drainage:  Purulent   Drainage amount:  Moderate   Wound treatment:  Drain placed   Packing materials:  1/2 in iodoform gauze Post-procedure details:    Procedure completion:  Tolerated well, no immediate complications   Medications Ordered in the ED  Medications  lidocaine (XYLOCAINE) 2 % (with pres) injection 200 mg (has no administration in time range)    Initial Impression and Plan  Patient here with abscess, appears to be new location from the one incised at previous visit. Still on oral Abx. I&D as above with packing. Will extend oral Abx, recommend 48 hour re-eval for packing removal and wound check. RTED in the meantime for any other concerns.   ED Course       MDM Rules/Calculators/A&P Medical Decision Making Problems Addressed: Abscess of buttock, right: acute illness or injury  Risk Prescription drug management.     Final Clinical Impression(s) / ED Diagnoses Final diagnoses:  Abscess of buttock, right    Rx / DC Orders ED  Discharge Orders          Ordered    clindamycin (CLEOCIN) 300 MG capsule  4 times daily        05/12/23 0022             Pollyann Savoy, MD 05/12/23 [phone]

## 2023-05-12 NOTE — ED Notes (Signed)
Area of I&D covered with gauze and secured with tape.

## 2023-05-12 NOTE — ED Provider Notes (Incomplete)
    EMERGENCY DEPARTMENT AT [facility]  Provider Note  CSN: [id number] Arrival date & time: 05/11/23 1859  History Chief Complaint  Patient presents with  . Wound Check    Dylan Shepard is a 27 y.o. male here for re-ealuation of buttock abscess, initially seen on 10/21 and had bedside US reportedly negative for abscess, given oral Abx but not improving. Seen again on 10/23 in follow up and had an abscess drained and Abx switched to clindamycin. He reports he continues to have pain, swelling and drainage to that area. No fevers. He has 2 days of clindamycin remaining.    Home Medications Prior to Admission medications   Medication Sig Start Date End Date Taking? Authorizing Provider  clindamycin (CLEOCIN) 300 MG capsule Take 1 capsule (300 mg total) by mouth 4 (four) times daily for 7 days. 05/08/23 05/15/23  Peter Garter, PA  levETIRAcetam (KEPPRA) 500 MG tablet Take 500 mg by mouth 2 (two) times daily. 04/07/20   [provider]  levETIRAcetam (KEPPRA) 750 MG tablet Take 2 tablets (1,500 mg total) by mouth 2 (two) times daily. 11/01/21   Ihor Austin, NP  topiramate (TOPAMAX) 25 MG tablet Take 1 tablet (25 mg total) by mouth 2 (two) times daily. 08/09/21   Ihor Austin, NP     Allergies    Sudafed [pseudoephedrine]   Review of Systems   Review of Systems Please see HPI for pertinent positives and negatives  Physical Exam BP (!) 148/85 (BP Location: Right Arm)   Pulse 95   Temp 99.6 F (37.6 C) (Oral)   Resp 18   Wt 97.1 kg   SpO2 99%   BMI 36.73 kg/m   Physical Exam  ED Results / Procedures / Treatments   EKG None  Procedures Procedures  Medications Ordered in the ED Medications  lidocaine (XYLOCAINE) 2 % (with pres) injection 200 mg (has no administration in time range)    Initial Impression and Plan  ***  ED Course       MDM Rules/Calculators/A&P Medical Decision Making Risk Prescription drug  management.     Final Clinical Impression(s) / ED Diagnoses Final diagnoses:  None    Rx / DC Orders ED Discharge Orders     None

## 2023-05-13 ENCOUNTER — Other Ambulatory Visit: Payer: Self-pay

## 2023-05-13 ENCOUNTER — Emergency Department (HOSPITAL_BASED_OUTPATIENT_CLINIC_OR_DEPARTMENT_OTHER)
Admission: EM | Admit: 2023-05-13 | Discharge: 2023-05-13 | Disposition: A | Discharge status: Home / Self Care | Payer: Medicaid Other | Attending: Emergency Medicine | Admitting: Emergency Medicine

## 2023-05-13 ENCOUNTER — Encounter (HOSPITAL_BASED_OUTPATIENT_CLINIC_OR_DEPARTMENT_OTHER): Payer: Self-pay

## 2023-05-13 DIAGNOSIS — L0291 Cutaneous abscess, unspecified: Secondary | ICD-10-CM | POA: Diagnosis present

## 2023-05-13 NOTE — ED Provider Notes (Signed)
[place] EMERGENCY DEPARTMENT AT [facility] Provider Note   CSN: [id number] Arrival date & time: 05/13/23  1216     History  Chief Complaint  Patient presents with   Wound Check    Dylan Shepard is a 27 y.o. male.  Patient-year-old male with a history of pilonidal abscess who presents to the emergency department for a wound check.  Patient reports that on approximately 10/11 developed an abscess on his right gluteal cleft.  Went to urgent care was started on doxycycline.  Came to the emergency department had an ultrasound that was negative for abscess on 10/21 he was started on Bactrim at that time.  Return to the emergency department on 10/23 and had an incision and drainage.  On 10/26 the patient returned to the emergency department and had a repeat incision and drainage with packing.  Was started on clindamycin which she is still taking at this time and reports that he has 4 more days left.  Says that the wound has improved.  Still some mild drainage but no fevers since last week.  Says that the area is much smaller than it was before.       Home Medications Prior to Admission medications   Medication Sig Start Date End Date Taking? Authorizing Provider  clindamycin (CLEOCIN) 300 MG capsule Take 1 capsule (300 mg total) by mouth 4 (four) times daily for 5 days. 05/12/23 05/17/23  Pollyann Savoy, MD  levETIRAcetam (KEPPRA) 500 MG tablet Take 500 mg by mouth 2 (two) times daily. 04/07/20   [provider]  levETIRAcetam (KEPPRA) 750 MG tablet Take 2 tablets (1,500 mg total) by mouth 2 (two) times daily. 11/01/21   Ihor Austin, NP  topiramate (TOPAMAX) 25 MG tablet Take 1 tablet (25 mg total) by mouth 2 (two) times daily. 08/09/21   Ihor Austin, NP      Allergies    Sudafed [pseudoephedrine]    Review of Systems   Review of Systems  Physical Exam Updated Vital Signs BP 123/75   Pulse 100   Temp 99 F (37.2 C)   Resp 14   SpO2 99%  Physical  Exam Skin:    Findings: Erythema and rash present.          Comments: No subcutaneous emphysema palpated     ED Results / Procedures / Treatments   Labs (all labs ordered are listed, but only abnormal results are displayed) Labs Reviewed - No data to display  EKG None  Radiology No results found.  Procedures Procedures   EMERGENCY DEPARTMENT US SOFT TISSUE INTERPRETATION "Study: Limited Soft Tissue Ultrasound"  INDICATIONS: Soft tissue infection Multiple views of the body part were obtained in real-time with a multi-frequency linear probe  PERFORMED BY: Myself IMAGES ARCHIVED?: No SIDE:Right  BODY PART: Buttocks INTERPRETATION:  No abcess noted    Medications Ordered in ED Medications - No data to display  ED Course/ Medical Decision Making/ A&P                                 Medical Decision Making  Dylan Shepard is a 27 y.o. male with comorbidities that complicate the patient evaluation including pilonidal abscess who presents emergency department for wound check  Initial Ddx:  Pilonidal abscess, cellulitis, Fournier's gangrene  MDM/Course:  Patient has a history of pilonidal abscess.  Has undergone 2 incision and drainages.  Is currently on his third course of  antibiotics.  Reports that the wound is healing.  On exam is nontoxic-appearing.  No signs of Fournier's or other complication.  Still has some erythema and mild amount of drainage but wound appears to be much improved from before.  Feel that this packing can be removed at this time.  Will have him follow-up with his primary doctor in several days as well.  Counseled to continue his clindamycin course for the next 4 days as prescribed.  Instructed on return precautions prior to discharge.   This patient presents to the ED for concern of complaints listed in HPI, this involves an extensive number of treatment options, and is a complaint that carries with it a high risk of complications and morbidity.  Disposition including potential need for admission considered.   Dispo: DC Home. Return precautions discussed including, but not limited to, those listed in the AVS. Allowed pt time to ask questions which were answered fully prior to dc.  Records reviewed ED Visit Notes I have reviewed the patients home medications and made adjustments as needed  Portions of this note were generated with Dragon dictation software. Dictation errors may occur despite best attempts at proofreading.           Final Clinical Impression(s) / ED Diagnoses Final diagnoses:  Abscess    Rx / DC Orders ED Discharge Orders     None         Rondel Baton, MD 05/13/23 1909

## 2023-05-13 NOTE — Discharge Instructions (Signed)
Continue your clindamycin.  Follow-up with your primary doctor for a wound check in 2 to 3 days.  Return to the emergency department if you develop fevers, worsening pain, or swelling.

## 2023-05-13 NOTE — ED Triage Notes (Signed)
Pt presents, recently seen for wound drainage and packing. Pt coming in to have wound check-up. Feels wound is 'still infected'.  Reporting minimal drainage at this time. Denies any chills or fever

## 2023-05-29 ENCOUNTER — Encounter: Payer: Self-pay | Admitting: Adult Health

## 2023-05-29 ENCOUNTER — Ambulatory Visit: Payer: Medicaid Other | Admitting: Adult Health

## 2023-05-29 DIAGNOSIS — R569 Unspecified convulsions: Secondary | ICD-10-CM

## 2023-05-29 DIAGNOSIS — R519 Headache, unspecified: Secondary | ICD-10-CM | POA: Diagnosis not present

## 2023-05-29 MED ORDER — TOPIRAMATE 50 MG PO TABS: 50.0000 mg | ORAL_TABLET | Freq: Two times a day (BID) | ORAL | 5 refills | Status: DC

## 2023-05-29 MED ORDER — LEVETIRACETAM 750 MG PO TABS: 1500.0000 mg | ORAL_TABLET | Freq: Two times a day (BID) | ORAL | 3 refills | Status: DC

## 2023-05-29 NOTE — Progress Notes (Signed)
I agree with the above plan 

## 2023-05-29 NOTE — Progress Notes (Signed)
[facility] [address] [place]. [place] [address] [phone]       OFFICE FOLLOW UP NOTE  Dylan Shepard Date of Birth:  June 26, 1996 Medical Record Number:  [id number]    Primary neurologist: Dr. Pearlean Shepard Referring MD: Dylan Shepard  Reason for referral: Seizures Reason for visit: starring off episodes and headaches   Chief Complaint  Patient presents with   Follow-up    Rm 3, here alone Pt is here following up on seizures. Pt states he continues having seizures in his sleep, absent seizures, states his coworkers have noticed he has starring off spells.       HPI:   Update 05/29/2023 JM: Patient returns for follow-up visit with prior visit almost 27 years ago.  At prior visit, complained of staring episodes about 2-3 times per week as well as occipital headaches.  He was started on topiramate 25 mg twice daily in addition to Keppra 1500 mg twice daily.  Repeat EEG negative for seizures.  Reports prolonged time since visit due to loss of insurance, his PCP was assisting with medication refills. He reports continued staring episodes about 2-3 times per week. Also concerned of nocturnal seizures as he can wake up feeling out of it with a headache which will gradually improve, no tongue biting or incontinence, occurs about 1-2 times per week.  Denies any generalized tonic-clonic seizures.  He continues to have headaches a couple times per week, can be triggered by weather and bariatric changes, associated with photophobia and nausea, use of Tylenol as needed with benefit.  Reports compliance on Keppra 1500 mg twice daily and topiramate 25 mg twice daily, denies any missed dosages, denies side effects.  He continues to work but questions short-term/partial disability as recurrent seizures interfering with working.     History provided for reference purposes only Update 08/09/2021 JM: Returns for follow-up visit per patient request after prior initial consult visit  with Dr. Pearlean Shepard almost 27 years ago (although recommended 45-month follow-up).   C/o headaches started beginning of December - occipital area.  Tightness/pressure sensation.  Can be associated with photophobia, phonophobia and N/V.  Initially occurring daily but gradually improving over the past couple of weeks - reports 3 headaches past 2 weeks. No prior hx of migraines or headaches.  Not associated with visual changes. At times, can be debilitating. Will use Tylenol or ibuprofen occasionally with some benefit but will quickly return. Denies neck pain or stiffness. Denies fatigue or issues with sleeping.   Since November, reports being told he is having "starring episodes". Unsure how long these last for. Will feel disoriented after but quickly return back to baseline. Is unaware of this occurring and no symptoms prior. Reports being told of these events 2-3x per week. Hasn't had any events since beginning of this month. These have never happened before. Typical seizures generalized tonic-clonic.   Does endorse increased stressors in November and December, working at [employer] during holiday season with increased hours. His hours have since been reduced since beginning of this month.  Reports compliance on Keppra 1500 mg twice daily -denies side effects. Denies any other factors such as any medication changes around onset or illness. He does not have an established PCP   MRI BRAIN w/wo contrast 09/10/2020 IMPRESSION:  Normal MRI brain (with and without).    EEG  09/19/2020 Summary  Normal electroencephalogram, awake, asleep and with activation procedures. There are no focal lateralizing or epileptiform features.   04/15/2020 (completed at [facility] neurology) abnormal  awake and asleep EEG due to the presence of phase reversing sharp waves over the left temporal region.  This is felt to be consistent with a focal area of neuronal dysfunction with epileptogenic potential over the left temporal area.  No  electrographic seizures were seen.   Consult visit 08/24/2020 Dr. Pearlean Shepard: Dylan Shepard is a 27 year old African-American male seen today for initial consultation visit for seizures.  He is accompanied by his mother.  History is obtained from them and review of recent electronic medical records from ER visit.  Patient states he has history of seizure disorder since age 37 when he was in seventh grade.  Seizures are usually generalized tonic-clonic without any aura.  He is unconscious.  Usually is out for about 5 minutes then wakes up disoriented confused and is tired for the rest of the day.  Denies specific aura or triggers except running out of medications for being under stress.  He has been on Keppra before and was on 1250 mg twice daily for 6 years when the dose was increased 2 months ago to 1500 twice daily as he had a couple of breakthrough seizures.  His seizure frequency has been barely 1 or 2/year until 2015 and when he went to college his seizure frequency went up and this was related to stress and hence the dose of Keppra was increased from 500 twice daily to 1250 twice daily.  After he left college is done well and did not have any breakthrough seizures until October December last year when he had 3 or 4 seizures in that.  Patient attribute this to his altered sleep pattern since he was working some jobs where he had to work night shifts and did not get reliable sleep.  Patient ran out of seizure medications and went to the ER on 06/22/2020 and the ER physician spoke to neurologist on-call who recommended adding Vimpat 50 mg twice daily to his Keppra but patient has not filled that prescription and remains on Keppra alone.  He has had no more seizures since the ER visit 2 months ago.  The patient's mother informs me that patient probably had some perinatal hypoxia is she needed him emergency C-section since patient was stuck in the birth canal during her delivery.  Patient has had decreased size of his  right eye and face but is not sure if he has decreased size of the right arm or leg he has fairly good strength.  He has not had any intellectual or milestone delays.  Patient was followed at [facility] neurology in [place] and he has recently taken up a job in [place] and moved here but I do not have any of those records.  Patient has shown me EEG report from [facility] in [facility] which he has on his cell phone from 04/21/2020 which showed left temporal sharp waves with phase reversal at T3     ROS:   14 system review of systems is positive for those listed in HPI and all other systems negative    PMH:  Past Medical History:  Diagnosis Date   Migraines    Seizures (HCC)     Social History:  Social History   Socioeconomic History   Marital status: Single    Spouse name: Not on file   Number of children: 0   Years of education: Not on file   Highest education level: Bachelor's degree (e.g., BA, AB, BS)  Occupational History   Occupation: part time  x2 jobs  Tobacco Use   Smoking status: Never   Smokeless tobacco: Never  Substance and Sexual Activity   Alcohol use: Yes    Comment: occassionally   Drug use: Never   Sexual activity: Not on file  Other Topics Concern   Not on file  Social History Narrative   Lives with roommate   Right Handed   No caffeine   Social Determinants of Health   Financial Resource Strain: Not on file  Food Insecurity: Not on file  Transportation Needs: Not on file  Physical Activity: Not on file  Stress: Not on file  Social Connections: Not on file  Intimate Partner Violence: Not on file    Medications:   Current Outpatient Medications on File Prior to Visit  Medication Sig Dispense Refill   levETIRAcetam (KEPPRA) 750 MG tablet Take 2 tablets (1,500 mg total) by mouth 2 (two) times daily. 360 tablet 3   topiramate (TOPAMAX) 25 MG tablet Take 1 tablet (25 mg total) by mouth 2 (two) times daily. 60  tablet 5   No current facility-administered medications on file prior to visit.    Allergies:   Allergies  Allergen Reactions   Sudafed [Pseudoephedrine]     Not allergic, just interacts with Keppra    Physical Exam Today's Vitals   05/29/23 0737  BP: 125/65  Pulse: 80  Weight: 211 lb (95.7 kg)  Height: 5\' 4"  (1.626 m)    Body mass index is 36.22 kg/m.   General: Obese very pleasant young African-American male, seated, in no evident distress Head: head normocephalic and atraumatic.   Neck: supple with no carotid or supraclavicular bruits Cardiovascular: regular rate and rhythm, no murmurs Musculoskeletal: no deformity Skin:  no rash/petichiae Vascular:  Normal pulses all extremities  Neurologic Exam Mental Status: Awake and fully alert. Oriented to place and time. Recent and remote memory intact. Attention span, concentration and fund of knowledge appropriate. Mood and affect appropriate.  Cranial Nerves: Right eye is small in caliber with decreased palpebral fissure. Pupils equal, briskly reactive to light. Extraocular movements full without nystagmus. Visual fields full to confrontation. Hearing intact.  Very subtle hemiatrophy of the right face compared to the left (chronic).  Sensation intact. Face, tongue, palate moves normally and symmetrically.  Motor: Normal bulk and tone. Normal strength in all tested extremity muscles.  Mild right grip weakness. Sensory.: intact to touch , pinprick , position and vibratory sensation.  Coordination: Rapid alternating movements normal in all extremities. Finger-to-nose and heel-to-shin performed accurately bilaterally. Gait and Station: Arises from chair without difficulty. Stance is normal. Gait demonstrates normal stride length and balance . Able to heel, toe and tandem walk without difficulty.  Reflexes: 1+ and symmetric. Toes downgoing.       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 MR brain 08/2020 unremarkable. EEG 09/2020 unremarkable. C/o new onset headaches and starring off episodes since Nov/Dec 2022 fell at that time to be in setting of increased stressors but headaches and staring episodes persist as well as possible nocturnal seizures.     1.  Seizures -Continued staring spells likely focal type seizure -Possible nocturnal seizures, wakes up feeling out of it and has headache which gradually resolves, no incontinence or tongue biting -Recommend increasing topiramate to 50 mg twice daily (as well as for  headache prevention) - will follow up in 2 weeks for likely need of further dosage increase -continue Keppra 1500 mg twice daily -consider VNS therapy, will reach out to VNS rep to further discuss -Repeat EEG 08/2021 negative -consider 72 hr EEG if seizures persist -Routine lab work by PCP -Avoid seizure triggers such as stress, sleep deprivation, medication noncompliance, drastic change in dietary habits, etc -No driving for 6 months after seizure activity per [place] law -Typically, do not provide disability for seizures but can consider short term while trying to get seizures under better control but will further discuss with Dr. Pearlean Shepard for further input   2.  Headaches -Some migrainous features with photophobia and nausea -Increase topiramate to 50 mg twice daily -Use of Tylenol as needed    Follow-up in 6 months or call earlier if needed    CC:  [facility] provider: Dr. Daiva Nakayama, Suzan Slick, PA   I spent 40 minutes of face-to-face and non-face-to-face time with patient.  This included previsit chart review, lab review, study review, order entry, electronic health record documentation, patient education and discussion regarding above diagnoses and  treatment plan and answered all other questions to patient's satisfaction  Ihor Austin, [facility]  [facility] [address] [place], [place] [address]  Phone [phone] Fax [phone] Note: This document was prepared with digital dictation and possible smart phrase technology. Any transcriptional errors that result from this process are unintentional.

## 2023-05-29 NOTE — Patient Instructions (Addendum)
Continue keppra 1500mg  twice daily   Increase topamax to 50mg  twice daily for continued headaches and seizures  Please call if seizures persist   Can consider looking into VNS therapy if seizures persist     Follow up in 6 months or call earlier if needed

## 2023-06-17 ENCOUNTER — Encounter: Payer: Self-pay | Admitting: Adult Health

## 2023-06-20 ENCOUNTER — Encounter (HOSPITAL_BASED_OUTPATIENT_CLINIC_OR_DEPARTMENT_OTHER): Payer: Self-pay

## 2023-06-20 ENCOUNTER — Emergency Department (HOSPITAL_BASED_OUTPATIENT_CLINIC_OR_DEPARTMENT_OTHER)
Admission: EM | Admit: 2023-06-20 | Discharge: 2023-06-20 | Disposition: A | Discharge status: Home / Self Care | Payer: Medicaid Other | Attending: Emergency Medicine | Admitting: Emergency Medicine

## 2023-06-20 ENCOUNTER — Other Ambulatory Visit: Payer: Self-pay

## 2023-06-20 DIAGNOSIS — L293 Anogenital pruritus, unspecified: Secondary | ICD-10-CM | POA: Insufficient documentation

## 2023-06-20 DIAGNOSIS — L0231 Cutaneous abscess of buttock: Secondary | ICD-10-CM | POA: Diagnosis present

## 2023-06-20 DIAGNOSIS — K6289 Other specified diseases of anus and rectum: Secondary | ICD-10-CM

## 2023-06-20 MED ORDER — BACITRACIN-NEOMYCIN-POLYMYXIN OINTMENT TUBE: 1.0000 | TOPICAL_OINTMENT | Freq: Three times a day (TID) | CUTANEOUS | 0 refills | Status: AC

## 2023-06-20 NOTE — ED Triage Notes (Signed)
In for eval recheck of right buttock abscess with continued drainage and mild pain in certain sitting positions. Was seen approx 1 month ago with I&D procedure to area. Completed antibiotic course.

## 2023-06-20 NOTE — Discharge Instructions (Addendum)
Start applying the topical medication that is prescribed. Keep the affected area clean and dry.  If you have recurrent issues of infection or abscess, consider calling the general surgeon for a close follow-up.  [facility] also has a walk-in urgent care that can possibly assist you.  If you start having severe pain, increasing drainage, fevers or chills please return to the ER.

## 2023-06-22 NOTE — ED Provider Notes (Signed)
[place] EMERGENCY DEPARTMENT AT [facility] Provider Note   CSN: [id number] Arrival date & time: 06/20/23  1159     History  Chief Complaint  Patient presents with   Abscess    Dylan Shepard is a 27 y.o. male.  HPI     Pt comes in with cc of R sided buttock abscess.  Patient has a history of pilonidal cyst.  He comes into the ER because recently started having some increasing discomfort over the abscess site along with mild drainage.  He notices drainage when he wipes himself.  He has increased pain with certain positions.  He has finished antibiotic course.  Home Medications Prior to Admission medications   Medication Sig Start Date End Date Taking? Authorizing Provider  levETIRAcetam (KEPPRA) 750 MG tablet Take 2 tablets (1,500 mg total) by mouth 2 (two) times daily. 05/29/23  Yes McCue, Shanda Bumps, NP  neomycin-bacitracin-polymyxin (NEOSPORIN) OINT Apply 1 Application topically 3 (three) times daily. 06/20/23  Yes Derwood Kaplan, MD  topiramate (TOPAMAX) 50 MG tablet Take 1 tablet (50 mg total) by mouth 2 (two) times daily. 05/29/23  Yes Ihor Austin, NP      Allergies    Sudafed [pseudoephedrine]    Review of Systems   Review of Systems  All other systems reviewed and are negative.   Physical Exam Updated Vital Signs BP 128/69   Pulse 84   Temp 98.3 F (36.8 C)   Resp 20   Ht 5\' 4"  (1.626 m)   Wt 95.3 kg   SpO2 100%   BMI 36.05 kg/m  Physical Exam Vitals and nursing note reviewed.  Constitutional:      Appearance: He is well-developed.  HENT:     Head: Atraumatic.  Cardiovascular:     Rate and Rhythm: Normal rate.  Pulmonary:     Effort: Pulmonary effort is normal.  Musculoskeletal:     Cervical back: Neck supple.  Skin:    General: Skin is warm.     Comments: Patient has induration without fluctuance but with discomfort around the gluteal cleft medially.  Patient has no purulent drainage upon pressure, but there was some serosanguineous  drainage over the dressing.  No crepitus.  Neurological:     Mental Status: He is alert and oriented to person, place, and time.     ED Results / Procedures / Treatments   Labs (all labs ordered are listed, but only abnormal results are displayed) Labs Reviewed - No data to display  EKG None  Radiology No results found.  Procedures Procedures    Medications Ordered in ED Medications - No data to display  ED Course/ Medical Decision Making/ A&P                                 Medical Decision Making  27 year old male comes in with chief complaint of recurrent discomfort over his gluteal region.  He has been diagnosed as pilonidal cyst and recently has had I&D.  He also finished antibiotic course early November.  More recently he started having some increasing discomfort and drainage at the site of his discomfort when he is cleaning himself.  On exam, there is no clear evidence of any abscess.  He does have some hypertrophy of the skin, but no profound induration or fluctuance.  I do see some serosanguineous drainage over the dressing material, but unable to express any drainage with palpation.  The drainage appears  to be coming at the site of previous incision.  Patient is concerned about recurrent episodes.  He did not have this issue until earlier this year.  He is requesting follow-up management that can help him prevent future episodes or have him be managed by a team that can help him urgently if he has flareups.  I discussed with him that at this time he does not need I&D.  We will put him on topical antibiotics.  We do not know how his symptoms will evolve, if he starts having worsening symptoms and he will need to return to the ER.  We will give him phone number for [facility] given that they have an urgent walk-in clinic.  I do worry that if this is a recurrent issue, then given that it is perianal and location he can start developing fistulas and other  complications.  For now things look well, and we will discharge patient with return precautions to the ER and information on surgery clinic that he can use if needed.  Final Clinical Impression(s) / ED Diagnoses Final diagnoses:  Irritation of skin of perianal region    Rx / DC Orders ED Discharge Orders          Ordered    neomycin-bacitracin-polymyxin (NEOSPORIN) OINT  3 times daily        06/20/23 1402              Derwood Kaplan, MD 06/22/23 1122

## 2023-11-23 ENCOUNTER — Encounter (HOSPITAL_BASED_OUTPATIENT_CLINIC_OR_DEPARTMENT_OTHER): Payer: Self-pay | Admitting: *Deleted

## 2023-11-23 ENCOUNTER — Other Ambulatory Visit: Payer: Self-pay

## 2023-11-23 ENCOUNTER — Emergency Department (HOSPITAL_BASED_OUTPATIENT_CLINIC_OR_DEPARTMENT_OTHER)
Admission: EM | Admit: 2023-11-23 | Discharge: 2023-11-23 | Disposition: A | Discharge status: Home / Self Care | Attending: Emergency Medicine | Admitting: Emergency Medicine

## 2023-11-23 DIAGNOSIS — L0591 Pilonidal cyst without abscess: Secondary | ICD-10-CM | POA: Diagnosis present

## 2023-11-23 MED ORDER — DOXYCYCLINE HYCLATE 100 MG PO CAPS: 100.0000 mg | ORAL_CAPSULE | Freq: Two times a day (BID) | ORAL | 0 refills | Status: DC

## 2023-11-23 NOTE — ED Provider Notes (Signed)
 [place] EMERGENCY DEPARTMENT AT [facility] Provider Note   CSN: [id number] Arrival date & time: 11/23/23  1557     History Chief Complaint  Patient presents with   Abscess     Abscess  Dylan Shepard is a 28 y.o. male presenting for a recurrent pilonidal cyst on the right side of the gluteal cleft.  He is concerned today that it continues to drain clear to yellowish fluid, and has increased tenderness and discomfort.  He states that is not as bad as he has previous visits to the ED however his pain is still increased.  He is concerned if it needs another incision and drainage.  No systemic symptoms, no fever, no bodyaches, no chills.  Denies nausea or vomiting.   Patient's recorded medical, surgical, social, medication list and allergies were reviewed in the Snapshot window as part of the initial history.   Review of Systems   Review of Systems  Skin:  Positive for wound.  All other systems reviewed and are negative.   Physical Exam Updated Vital Signs BP 134/83 (BP Location: Right Arm)   Pulse 86   Temp 98.6 F (37 C)   Resp 18   Ht 5\' 5"  (1.651 m)   Wt 98.9 kg   SpO2 100%   BMI 36.28 kg/m  Physical Exam Vitals and nursing note reviewed.  Constitutional:      General: He is not in acute distress.    Appearance: Normal appearance.  HENT:     Head: Normocephalic and atraumatic.     Mouth/Throat:     Mouth: Mucous membranes are moist.     Pharynx: Oropharynx is clear.  Eyes:     Extraocular Movements: Extraocular movements intact.     Conjunctiva/sclera: Conjunctivae normal.     Pupils: Pupils are equal, round, and reactive to light.  Cardiovascular:     Rate and Rhythm: Normal rate and regular rhythm.     Pulses: Normal pulses.     Heart sounds: Normal heart sounds. No murmur heard.    No friction rub. No gallop.  Pulmonary:     Effort: Pulmonary effort is normal.     Breath sounds: Normal breath sounds.  Abdominal:     General: Abdomen is flat.  Bowel sounds are normal.     Palpations: Abdomen is soft.  Musculoskeletal:        General: Normal range of motion.     Cervical back: Normal range of motion and neck supple.     Right lower leg: No edema.     Left lower leg: No edema.  Skin:    General: Skin is warm and dry.     Capillary Refill: Capillary refill takes less than 2 seconds.     Findings: Lesion present.          Comments: Openly draining subcutaneous cyst noted on the right side of the gluteal cleft.  There is no fluctuance at present, and while the area is indurated there are no notable fluid collections.  Neurological:     General: No focal deficit present.     Mental Status: He is alert. Mental status is at baseline.  Psychiatric:        Mood and Affect: Mood normal.      ED Course/ Medical Decision Making/ A&P    Procedures Procedures   Medications Ordered in ED Medications - No data to display  Medical Decision Making:   [name] is a 28 y.o. male who presented  to the ED today with concerns for pilonidal cyst on the right side of the gluteal cleft detailed above.     Complete initial physical exam performed, notably the patient  was alert and oriented without any apparent distress.  Noted openly draining subcutaneous cyst on the right side of the gluteal cleft on the distal aspect.  Notably there is no fluctuance of the area though it is erythematous and indurated.     Reviewed and confirmed nursing documentation for past medical history, family history, social history.    Initial Assessment:   With the patient's presentation of concerns for cyst, most likely diagnosis is draining pilonidal cyst.  Initial Plan:  As the cyst is openly draining and there is no areas of fluctuance, will defer incision and drainage at this time. Objective evaluation as below reviewed   Reassessment and Plan:   As noted will manage with outpatient oral doxycycline.  Discussed with patient to follow-up with his  primary care provider and he states that he does have an appointment for follow-up within the next 2 weeks.  Clinical Impression:  1. Pilonidal cyst      Discharge   Final Clinical Impression(s) / ED Diagnoses Final diagnoses:  Pilonidal cyst    Rx / DC Orders ED Discharge Orders          Ordered    doxycycline (VIBRAMYCIN) 100 MG capsule  2 times daily        11/23/23 1657              Juanetta Nordmann, [place] 11/23/23 1658    Dalene Duck, MD 11/23/23 [phone]

## 2023-11-23 NOTE — ED Triage Notes (Signed)
 Patient returning to ED POV reporting persistent abscess on his right buttocks. Patient had it drained months ago and reports it had improved but never fully resolved. Patient reporting recently noting more green colored drainage again.

## 2023-11-25 NOTE — Progress Notes (Unsigned)
 [facility] [address] [place]. [place] [address] [phone]       OFFICE FOLLOW UP NOTE  [name] Date of Birth:  August 17, 1995 Medical Record Number:  [id number]    Primary neurologist: Dr. Janett Medin Referring MD: Sueellen Emery  Reason for referral: Seizures Reason for visit: starring off episodes and headaches   No chief complaint on file.     HPI:   Update 11/26/2023 JM: Patient returns for follow-up visit.  At prior visit, reported continued focal type seizures and possible nocturnal seizures as well as migraine type headaches, increase topiramate  to 50 mg twice daily and continue Keppra  1500 mg twice daily.     History provided for reference purposes only Update 05/29/2023 JM: Patient returns for follow-up visit with prior visit almost 2 years ago.  At prior visit, complained of staring episodes about 2-3 times per week as well as occipital headaches.  He was started on topiramate  25 mg twice daily in addition to Keppra  1500 mg twice daily.  Repeat EEG negative for seizures.  Reports prolonged time since visit due to loss of insurance, his PCP was assisting with medication refills. He reports continued staring episodes about 2-3 times per week. Also concerned of nocturnal seizures as he can wake up feeling out of it with a headache which will gradually improve, no tongue biting or incontinence, occurs about 1-2 times per week.  Denies any generalized tonic-clonic seizures.  He continues to have headaches a couple times per week, can be triggered by weather and bariatric changes, associated with photophobia and nausea, use of Tylenol  as needed with benefit.  Reports compliance on Keppra  1500 mg twice daily and topiramate  25 mg twice daily, denies any missed dosages, denies side effects.  He continues to work but questions short-term/partial disability as recurrent seizures interfering with working.  Update 08/09/2021 JM: Returns for follow-up visit per  patient request after prior initial consult visit with Dr. Janett Medin almost 1 year ago (although recommended 65-month follow-up).   C/o headaches started beginning of December - occipital area.  Tightness/pressure sensation.  Can be associated with photophobia, phonophobia and N/V.  Initially occurring daily but gradually improving over the past couple of weeks - reports 3 headaches past 2 weeks. No prior hx of migraines or headaches.  Not associated with visual changes. At times, can be debilitating. Will use Tylenol  or ibuprofen  occasionally with some benefit but will quickly return. Denies neck pain or stiffness. Denies fatigue or issues with sleeping.   Since November, reports being told he is having "starring episodes". Unsure how long these last for. Will feel disoriented after but quickly return back to baseline. Is unaware of this occurring and no symptoms prior. Reports being told of these events 2-3x per week. Hasn't had any events since beginning of this month. These have never happened before. Typical seizures generalized tonic-clonic.   Does endorse increased stressors in November and December, working at [employer] during holiday season with increased hours. His hours have since been reduced since beginning of this month.  Reports compliance on Keppra  1500 mg twice daily -denies side effects. Denies any other factors such as any medication changes around onset or illness. He does not have an established PCP   MRI BRAIN w/wo contrast 09/10/2020 IMPRESSION:  Normal MRI brain (with and without).    EEG  09/19/2020 Summary  Normal electroencephalogram, awake, asleep and with activation procedures. There are no focal lateralizing or epileptiform features.   04/15/2020 (completed at [facility] neurology)  abnormal awake and asleep EEG due to the presence of phase reversing sharp waves over the left temporal region.  This is felt to be consistent with a focal area of neuronal dysfunction with epileptogenic  potential over the left temporal area.  No electrographic seizures were seen.   Consult visit 08/24/2020 Dr. Janett Medin: [name] is a 28 year old African-American male seen today for initial consultation visit for seizures.  He is accompanied by his mother.  History is obtained from them and review of recent electronic medical records from ER visit.  Patient states he has history of seizure disorder since age 37 when he was in seventh grade.  Seizures are usually generalized tonic-clonic without any aura.  He is unconscious.  Usually is out for about 5 minutes then wakes up disoriented confused and is tired for the rest of the day.  Denies specific aura or triggers except running out of medications for being under stress.  He has been on Keppra  before and was on 1250 mg twice daily for 6 years when the dose was increased 2 months ago to 1500 twice daily as he had a couple of breakthrough seizures.  His seizure frequency has been barely 1 or 2/year until 2015 and when he went to college his seizure frequency went up and this was related to stress and hence the dose of Keppra  was increased from 500 twice daily to 1250 twice daily.  After he left college is done well and did not have any breakthrough seizures until October December last year when he had 3 or 4 seizures in that.  Patient attribute this to his altered sleep pattern since he was working some jobs where he had to work night shifts and did not get reliable sleep.  Patient ran out of seizure medications and went to the ER on 06/22/2020 and the ER physician spoke to neurologist on-call who recommended adding Vimpat  50 mg twice daily to his Keppra  but patient has not filled that prescription and remains on Keppra  alone.  He has had no more seizures since the ER visit 2 months ago.  The patient's mother informs me that patient probably had some perinatal hypoxia is she needed him emergency C-section since patient was stuck in the birth canal during her delivery.   Patient has had decreased size of his right eye and face but is not sure if he has decreased size of the right arm or leg he has fairly good strength.  He has not had any intellectual or milestone delays.  Patient was followed at [facility] neurology in [place] and he has recently taken up a job in [place] and moved here but I do not have any of those records.  Patient has shown me EEG report from [facility] in [place]  which he has on his cell phone from 04/21/2020 which showed left temporal sharp waves with phase reversal at T3     ROS:   14 system review of systems is positive for those listed in HPI and all other systems negative    PMH:  Past Medical History:  Diagnosis Date   Migraines    Seizures (HCC)     Social History:  Social History   Socioeconomic History   Marital status: Single    Spouse name: Not on file   Number of children: 0   Years of education: Not on file   Highest education level: Bachelor's degree (e.g., BA, AB, BS)  Occupational History   Occupation: part  time x2 jobs  Tobacco Use   Smoking status: Never   Smokeless tobacco: Never  Vaping Use   Vaping status: Never Used  Substance and Sexual Activity   Alcohol use: Yes    Comment: occassionally   Drug use: Never   Sexual activity: Not on file  Other Topics Concern   Not on file  Social History Narrative   Lives with roommate   Right Handed   No caffeine   Social Drivers of Corporate investment banker Strain: Not on file  Food Insecurity: Not on file  Transportation Needs: Not on file  Physical Activity: Not on file  Stress: Not on file  Social Connections: Not on file  Intimate Partner Violence: Not on file    Medications:   Current Outpatient Medications on File Prior to Visit  Medication Sig Dispense Refill   doxycycline (VIBRAMYCIN) 100 MG capsule Take 1 capsule (100 mg total) by mouth 2 (two) times daily. 20 capsule 0   levETIRAcetam   (KEPPRA ) 750 MG tablet Take 2 tablets (1,500 mg total) by mouth 2 (two) times daily. 360 tablet 3   neomycin -bacitracin -polymyxin (NEOSPORIN) OINT Apply 1 Application topically 3 (three) times daily. 28.4 g 0   topiramate  (TOPAMAX ) 50 MG tablet Take 1 tablet (50 mg total) by mouth 2 (two) times daily. 60 tablet 5   No current facility-administered medications on file prior to visit.    Allergies:   Allergies  Allergen Reactions   Sudafed [Pseudoephedrine]     Not allergic, just interacts with Keppra     Physical Exam There were no vitals filed for this visit.   There is no height or weight on file to calculate BMI.   General: Obese very pleasant young African-American male, seated, in no evident distress Head: head normocephalic and atraumatic.   Neck: supple with no carotid or supraclavicular bruits Cardiovascular: regular rate and rhythm, no murmurs Musculoskeletal: no deformity Skin:  no rash/petichiae Vascular:  Normal pulses all extremities  Neurologic Exam Mental Status: Awake and fully alert. Oriented to place and time. Recent and remote memory intact. Attention span, concentration and fund of knowledge appropriate. Mood and affect appropriate.  Cranial Nerves: Right eye is small in caliber with decreased palpebral fissure. Pupils equal, briskly reactive to light. Extraocular movements full without nystagmus. Visual fields full to confrontation. Hearing intact.  Very subtle hemiatrophy of the right face compared to the left (chronic).  Sensation intact. Face, tongue, palate moves normally and symmetrically.  Motor: Normal bulk and tone. Normal strength in all tested extremity muscles.  Mild right grip weakness. Sensory.: intact to touch , pinprick , position and vibratory sensation.  Coordination: Rapid alternating movements normal in all extremities. Finger-to-nose and heel-to-shin performed accurately bilaterally. Gait and Station: Arises from chair without difficulty.  Stance is normal. Gait demonstrates normal stride length and balance . Able to heel, toe and tandem walk without difficulty.  Reflexes: 1+ and symmetric. Toes downgoing.       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 MR brain 08/2020 unremarkable. EEG 09/2020 unremarkable. C/o new onset headaches and starring off episodes since Nov/Dec 2022 fell at that time to be in setting of increased stressors but headaches and staring episodes persist as well as possible nocturnal seizures.     1.  Seizures -Continued staring spells  likely focal type seizure -Possible nocturnal seizures, wakes up feeling out of it and has headache which gradually resolves, no incontinence or tongue biting -Recommend increasing topiramate  to 50 mg twice daily (as well as for headache prevention) - will follow up in 2 weeks for likely need of further dosage increase -continue Keppra  1500 mg twice daily -consider VNS therapy, will reach out to VNS rep to further discuss -Repeat EEG 08/2021 negative -consider 72 hr EEG if seizures persist -Routine lab work by PCP -Avoid seizure triggers such as stress, sleep deprivation, medication noncompliance, drastic change in dietary habits, etc -No driving for 6 months after seizure activity per [place] law -Typically, do not provide disability for seizures but can consider short term while trying to get seizures under better control but will further discuss with Dr. Janett Medin for further input   2.  Headaches -Some migrainous features with photophobia and nausea -Increase topiramate  to 50 mg twice daily -Use of Tylenol  as needed    Follow-up in 6 months or call earlier if needed    CC:  [facility] provider: Dr. Melissa Spring, Alison Applebaum, PA   I spent 40 minutes of face-to-face and non-face-to-face time with patient.  This included previsit chart review, lab review, study review, order entry, electronic health record documentation, patient education and discussion regarding above diagnoses and treatment plan and answered all other questions to patient's satisfaction  Johny Nap, [facility]  [facility] [address] [place], [place] [address]  Phone [phone] Fax [phone] Note: This document was prepared with digital dictation and possible smart phrase technology. Any transcriptional errors that result from this process are unintentional.

## 2023-11-26 ENCOUNTER — Ambulatory Visit: Payer: Medicaid Other | Admitting: Adult Health

## 2023-11-26 ENCOUNTER — Encounter: Payer: Self-pay | Admitting: Adult Health

## 2023-11-26 VITALS — BP 124/80 | HR 85 | Ht 65.0 in | Wt 227.0 lb

## 2023-11-26 DIAGNOSIS — G43909 Migraine, unspecified, not intractable, without status migrainosus: Secondary | ICD-10-CM

## 2023-11-26 DIAGNOSIS — R569 Unspecified convulsions: Secondary | ICD-10-CM

## 2023-11-26 DIAGNOSIS — G44209 Tension-type headache, unspecified, not intractable: Secondary | ICD-10-CM | POA: Diagnosis not present

## 2023-11-26 MED ORDER — TOPIRAMATE 50 MG PO TABS: 100.0000 mg | ORAL_TABLET | Freq: Two times a day (BID) | ORAL | 5 refills | Status: DC

## 2023-11-26 MED ORDER — LEVETIRACETAM 750 MG PO TABS: 1500.0000 mg | ORAL_TABLET | Freq: Two times a day (BID) | ORAL | 3 refills | Status: DC

## 2023-11-26 NOTE — Patient Instructions (Addendum)
 Your Plan:   Increase Topamax  gradually to 100 mg twice daily, start by increasing nighttime dose to 100 mg and continuing with morning 50 mg dose for 1 week then increase to 100 mg twice daily  Continue Keppra  1500 mg twice daily  Please call with continued seizure activity or any worsening headaches     Follow up in 6 months or call earlier if needed      Thank you for coming to see us  at [facility]. I hope we have been able to provide you high quality care today.  You may receive a patient satisfaction survey over the next few weeks. We would appreciate your feedback and comments so that we may continue to improve ourselves and the health of our patients.

## 2024-01-03 ENCOUNTER — Encounter: Payer: Self-pay | Admitting: Adult Health

## 2024-01-03 DIAGNOSIS — Z9189 Other specified personal risk factors, not elsewhere classified: Secondary | ICD-10-CM

## 2024-01-03 DIAGNOSIS — G43909 Migraine, unspecified, not intractable, without status migrainosus: Secondary | ICD-10-CM

## 2024-01-03 DIAGNOSIS — E669 Obesity, unspecified: Secondary | ICD-10-CM

## 2024-01-03 DIAGNOSIS — G4719 Other hypersomnia: Secondary | ICD-10-CM

## 2024-01-24 NOTE — Addendum Note (Signed)
 Addended by: WHITFIELD RAISIN L on: 01/24/2024 07:45 AM   Modules accepted: Orders

## 2024-01-31 ENCOUNTER — Telehealth: Payer: Self-pay | Admitting: Adult Health

## 2024-01-31 NOTE — Telephone Encounter (Signed)
 HST MCD Healthy blue pending

## 2024-02-06 NOTE — Telephone Encounter (Signed)
 Checked status on the portal it is still pending.

## 2024-03-11 ENCOUNTER — Encounter (HOSPITAL_BASED_OUTPATIENT_CLINIC_OR_DEPARTMENT_OTHER): Payer: Self-pay | Admitting: Emergency Medicine

## 2024-03-11 ENCOUNTER — Other Ambulatory Visit: Payer: Self-pay

## 2024-03-11 ENCOUNTER — Emergency Department (HOSPITAL_BASED_OUTPATIENT_CLINIC_OR_DEPARTMENT_OTHER)
Admission: EM | Admit: 2024-03-11 | Discharge: 2024-03-11 | Disposition: A | Discharge status: Home / Self Care | Attending: Emergency Medicine | Admitting: Emergency Medicine

## 2024-03-11 DIAGNOSIS — L0291 Cutaneous abscess, unspecified: Secondary | ICD-10-CM

## 2024-03-11 DIAGNOSIS — L0231 Cutaneous abscess of buttock: Secondary | ICD-10-CM | POA: Diagnosis present

## 2024-03-11 MED ORDER — DOXYCYCLINE HYCLATE 100 MG PO CAPS: 100.0000 mg | ORAL_CAPSULE | Freq: Two times a day (BID) | ORAL | 0 refills | Status: AC

## 2024-03-11 MED ORDER — DOXYCYCLINE HYCLATE 100 MG PO TABS
100.0000 mg | ORAL_TABLET | Freq: Once | ORAL | Status: AC
  Administered 2024-03-11: 100 mg via ORAL
  Filled 2024-03-11: qty 1

## 2024-03-11 NOTE — ED Triage Notes (Signed)
 ABSCESS ON buttock, recurring

## 2024-03-11 NOTE — Discharge Instructions (Addendum)
 Take next dose of antibiotic tonight before bedtime.  Follow-up with general surgeon primary care.  Return if symptoms worsen.  Continue Tylenol  and ibuprofen  for pain.

## 2024-03-11 NOTE — ED Notes (Signed)
 Reviewed AVS/discharge instruction with patient. Time allotted for and all questions answered. Patient is agreeable for d/c and escorted to ed exit by staff.

## 2024-03-11 NOTE — ED Provider Notes (Signed)
  EMERGENCY DEPARTMENT AT [facility] Provider Note   CSN: [id number] Arrival date & time: 03/11/24  1053     Patient presents with: Abscess   Dylan Shepard is a 28 y.o. male.   Patient here with abscess on his right buttocks history of the same.  Is open and draining.  He has a history of seizures.  Denies any fever or chills.  Has been doing warm compresses.  Denies any weakness numbness tingling.  No issues going to the bathroom otherwise.  The history is provided by the patient.       Prior to Admission medications   Medication Sig Start Date End Date Taking? Authorizing Provider  doxycycline  (VIBRAMYCIN ) 100 MG capsule Take 1 capsule (100 mg total) by mouth 2 (two) times daily. 03/11/24  Yes Temitope Griffing, DO  levETIRAcetam  (KEPPRA ) 750 MG tablet Take 2 tablets (1,500 mg total) by mouth 2 (two) times daily. 11/26/23   [place]field Raisin, NP  neomycin -bacitracin -polymyxin (NEOSPORIN) OINT Apply 1 Application topically 3 (three) times daily. 06/20/23   Charlyn Sora, MD  topiramate  (TOPAMAX ) 50 MG tablet Take 2 tablets (100 mg total) by mouth 2 (two) times daily. 11/26/23   Whitfield Raisin, NP    Allergies: Sudafed [pseudoephedrine]    Review of Systems  Updated Vital Signs BP 138/81   Pulse (!) 107   Temp 99.3 F (37.4 C) (Oral)   Resp 18   SpO2 100%   Physical Exam Vitals and nursing note reviewed. Exam conducted with a chaperone present.  Constitutional:      General: He is not in acute distress.    Appearance: He is not ill-appearing.  HENT:     Head: Normocephalic.     Nose: Nose normal.  Cardiovascular:     Pulses: Normal pulses.  Abdominal:     General: Abdomen is flat.  Skin:    General: Skin is warm.     Comments: He has a draining wound from the right gluteal fold with purulent drainage but no fluctuance no major erythema seems to be draining from old I&D site  Neurological:     General: No focal deficit present.     Mental Status: He  is alert.     (all labs ordered are listed, but only abnormal results are displayed) Labs Reviewed - No data to display  EKG: None  Radiology: No results found.   Procedures   Medications Ordered in the ED  doxycycline  (VIBRA -TABS) tablet 100 mg (has no administration in time range)                                    Medical Decision Making Risk Prescription drug management.   Marolyn Sheller is here for wound check.  Looks like he has a draining cyst from his right gluteal cleft.  History of the same.  Looks like it is draining from a prior I&D site.  I was able to express a fair amount of pus from this area.  There is no remaining fluctuance or fullness to this area.  Ultimately continue warm compresses at home.  Will put him on antibiotics.  Will refer him to surgery for follow-up.  This looks likely could be a pilonidal cyst that may be could be amenable to some surgical process.  Overall he understands wound care instructions.  Discharged in good condition.  Understands return precautions.  This chart was dictated using voice  recognition software.  Despite best efforts to proofread,  errors can occur which can change the documentation meaning.      Final diagnoses:  Abscess    ED Discharge Orders          Ordered    doxycycline  (VIBRAMYCIN ) 100 MG capsule  2 times daily        03/11/24 1131               [place], Juliene, DO 03/11/24 1133

## 2024-03-31 ENCOUNTER — Other Ambulatory Visit: Payer: Self-pay

## 2024-03-31 ENCOUNTER — Emergency Department (HOSPITAL_BASED_OUTPATIENT_CLINIC_OR_DEPARTMENT_OTHER): Admission: EM | Admit: 2024-03-31 | Discharge: 2024-03-31 | Disposition: A | Discharge status: Home / Self Care

## 2024-03-31 ENCOUNTER — Encounter (HOSPITAL_BASED_OUTPATIENT_CLINIC_OR_DEPARTMENT_OTHER): Payer: Self-pay

## 2024-03-31 DIAGNOSIS — G43809 Other migraine, not intractable, without status migrainosus: Secondary | ICD-10-CM | POA: Diagnosis not present

## 2024-03-31 DIAGNOSIS — R569 Unspecified convulsions: Secondary | ICD-10-CM | POA: Diagnosis present

## 2024-03-31 DIAGNOSIS — F439 Reaction to severe stress, unspecified: Secondary | ICD-10-CM | POA: Insufficient documentation

## 2024-03-31 DIAGNOSIS — G40919 Epilepsy, unspecified, intractable, without status epilepticus: Secondary | ICD-10-CM

## 2024-03-31 LAB — URINALYSIS, ROUTINE W REFLEX MICROSCOPIC
Bilirubin Urine: NEGATIVE
Glucose, UA: NEGATIVE mg/dL
Hgb urine dipstick: NEGATIVE
Ketones, ur: NEGATIVE mg/dL
Leukocytes,Ua: NEGATIVE
Nitrite: NEGATIVE
Protein, ur: NEGATIVE mg/dL
Specific Gravity, Urine: 1.009 (ref 1.005–1.030)
pH: 6.5 (ref 5.0–8.0)

## 2024-03-31 LAB — CBC WITH DIFFERENTIAL/PLATELET
Abs Immature Granulocytes: 0.02 K/uL (ref 0.00–0.07)
Basophils Absolute: 0.1 K/uL (ref 0.0–0.1)
Basophils Relative: 1 %
Eosinophils Absolute: 0 K/uL (ref 0.0–0.5)
Eosinophils Relative: 1 %
HCT: 43.1 % (ref 39.0–52.0)
Hemoglobin: 14.2 g/dL (ref 13.0–17.0)
Immature Granulocytes: 0 %
Lymphocytes Relative: 23 %
Lymphs Abs: 1.4 K/uL (ref 0.7–4.0)
MCH: 28.1 pg (ref 26.0–34.0)
MCHC: 32.9 g/dL (ref 30.0–36.0)
MCV: 85.3 fL (ref 80.0–100.0)
Monocytes Absolute: 0.3 K/uL (ref 0.1–1.0)
Monocytes Relative: 5 %
Neutro Abs: 4.1 K/uL (ref 1.7–7.7)
Neutrophils Relative %: 70 %
Platelets: 330 K/uL (ref 150–400)
RBC: 5.05 MIL/uL (ref 4.22–5.81)
RDW: 13.7 % (ref 11.5–15.5)
WBC: 5.8 K/uL (ref 4.0–10.5)
nRBC: 0 % (ref 0.0–0.2)

## 2024-03-31 LAB — BASIC METABOLIC PANEL WITH GFR
Anion gap: 14 (ref 5–15)
BUN: 7 mg/dL (ref 6–20)
CO2: 18 mmol/L — ABNORMAL LOW (ref 22–32)
Calcium: 9.4 mg/dL (ref 8.9–10.3)
Chloride: 104 mmol/L (ref 98–111)
Creatinine, Ser: 1.06 mg/dL (ref 0.61–1.24)
GFR, Estimated: >60 mL/min (ref >60)
Glucose, Bld: 101 mg/dL — ABNORMAL HIGH (ref 70–99)
Potassium: 4 mmol/L (ref 3.5–5.1)
Sodium: 136 mmol/L (ref 135–145)

## 2024-03-31 MED ORDER — KETOROLAC TROMETHAMINE 15 MG/ML IJ SOLN
15.0000 mg | Freq: Once | INTRAMUSCULAR | Status: AC
Start: 2024-03-31 — End: 2024-03-31
  Administered 2024-03-31: 15 mg via INTRAVENOUS
  Filled 2024-03-31: qty 1

## 2024-03-31 MED ORDER — ACETAMINOPHEN 325 MG PO TABS
650.0000 mg | ORAL_TABLET | Freq: Once | ORAL | Status: AC
Start: 2024-03-31 — End: 2024-03-31
  Administered 2024-03-31: 650 mg via ORAL
  Filled 2024-03-31: qty 2

## 2024-03-31 MED ORDER — SODIUM CHLORIDE 0.9 % IV BOLUS
500.0000 mL | Freq: Once | INTRAVENOUS | Status: AC
  Administered 2024-03-31: 500 mL via INTRAVENOUS

## 2024-03-31 NOTE — Discharge Instructions (Addendum)
 Stay on your medications as prescribed.  Call and follow-up with neurology in 1 to 2 weeks.

## 2024-03-31 NOTE — ED Notes (Signed)
 ED Provider at bedside.

## 2024-03-31 NOTE — ED Provider Notes (Signed)
 [place] EMERGENCY DEPARTMENT AT [facility] Provider Note   CSN: [id number] Arrival date & time: 03/31/24  1237     Patient presents with: Seizures   Dylan Shepard is a 28 y.o. male.  {Add pertinent medical, surgical, social history, OB history to HPI:1631} 28 year old male presents for evaluation of seizures.  He has a history of seizures and he takes Keppra  for them.  States he did not miss any doses of this.  Also states he has had an increase in life stress lately, has recently moved.  Last seizure was about a month ago.  Does admit to migraine headaches as well.   Seizures      Prior to Admission medications   Medication Sig Start Date End Date Taking? Authorizing Provider  doxycycline  (VIBRAMYCIN ) 100 MG capsule Take 1 capsule (100 mg total) by mouth 2 (two) times daily. 03/11/24   [place], Adam, DO  levETIRAcetam  (KEPPRA ) 750 MG tablet Take 2 tablets (1,500 mg total) by mouth 2 (two) times daily. 11/26/23   [place] Raisin, NP  neomycin -bacitracin -polymyxin (NEOSPORIN) OINT Apply 1 Application topically 3 (three) times daily. 06/20/23   Charlyn Sora, MD  topiramate  (TOPAMAX ) 50 MG tablet Take 2 tablets (100 mg total) by mouth 2 (two) times daily. 11/26/23   [place]field Raisin, NP    Allergies: Sudafed [pseudoephedrine]    Review of Systems  Constitutional:  Negative for chills and fever.  HENT:  Negative for ear pain and sore throat.   Eyes:  Negative for pain and visual disturbance.  Respiratory:  Negative for cough and shortness of breath.   Cardiovascular:  Negative for chest pain and palpitations.  Gastrointestinal:  Negative for abdominal pain and vomiting.  Genitourinary:  Negative for dysuria and hematuria.  Musculoskeletal:  Negative for arthralgias and back pain.  Skin:  Negative for color change and rash.  Neurological:  Positive for seizures and headaches. Negative for syncope.  All other systems reviewed and are negative.   Updated Vital  Signs BP 130/77   Pulse 91   Temp 98.9 F (37.2 C) (Oral)   Resp 14   SpO2 98%   Physical Exam Vitals and nursing note reviewed.  Constitutional:      General: He is not in acute distress.    Appearance: Normal appearance. He is well-developed. He is not ill-appearing.  HENT:     Head: Normocephalic and atraumatic.  Eyes:     Conjunctiva/sclera: Conjunctivae normal.  Cardiovascular:     Rate and Rhythm: Normal rate and regular rhythm.     Heart sounds: No murmur heard. Pulmonary:     Effort: Pulmonary effort is normal. No respiratory distress.     Breath sounds: Normal breath sounds.  Abdominal:     Palpations: Abdomen is soft.     Tenderness: There is no abdominal tenderness.  Musculoskeletal:        General: No swelling.     Cervical back: Neck supple.  Skin:    General: Skin is warm and dry.     Capillary Refill: Capillary refill takes less than 2 seconds.  Neurological:     General: No focal deficit present.     Mental Status: He is alert.  Psychiatric:        Mood and Affect: Mood normal.     (all labs ordered are listed, but only abnormal results are displayed) Labs Reviewed  CBC WITH DIFFERENTIAL/PLATELET  BASIC METABOLIC PANEL WITH GFR  URINALYSIS, ROUTINE W REFLEX MICROSCOPIC  EKG: None  Radiology: No results found.  {Document cardiac monitor, telemetry assessment procedure when appropriate:32947} Procedures   Medications Ordered in the ED  ketorolac  (TORADOL ) 15 MG/ML injection 15 mg (has no administration in time range)  acetaminophen  (TYLENOL ) tablet 650 mg (has no administration in time range)      {Click here for ABCD2, HEART and other calculators REFRESH Note before signing:1}                              Medical Decision Making Amount and/or Complexity of Data Reviewed Labs: ordered.  Risk OTC drugs. Prescription drug management.   ***  {Document critical care time when appropriate  Document review of labs and clinical  decision tools ie CHADS2VASC2, etc  Document your independent review of radiology images and any outside records  Document your discussion with family members, caretakers and with consultants  Document social determinants of health affecting pt's care  Document your decision making why or why not admission, treatments were needed:32947:::1}   Final diagnoses:  None    ED Discharge Orders     None

## 2024-03-31 NOTE — ED Triage Notes (Signed)
 Patient states hx of epilepsy. Was with a friend earlier who reported he had a seizure. States headache and light sensitivity now. Takes keppra . States no missed doses

## 2024-05-04 NOTE — Telephone Encounter (Signed)
 Started a new case for HST

## 2024-05-05 NOTE — Telephone Encounter (Signed)
 HST MCD Healthy blue no auth req via fax

## 2024-05-29 ENCOUNTER — Ambulatory Visit: Admitting: Neurology

## 2024-05-29 DIAGNOSIS — G4719 Other hypersomnia: Secondary | ICD-10-CM

## 2024-05-29 DIAGNOSIS — G4733 Obstructive sleep apnea (adult) (pediatric): Secondary | ICD-10-CM

## 2024-05-29 DIAGNOSIS — E669 Obesity, unspecified: Secondary | ICD-10-CM

## 2024-05-29 DIAGNOSIS — G43909 Migraine, unspecified, not intractable, without status migrainosus: Secondary | ICD-10-CM

## 2024-05-29 DIAGNOSIS — Z9189 Other specified personal risk factors, not elsewhere classified: Secondary | ICD-10-CM

## 2024-06-03 ENCOUNTER — Encounter: Payer: Self-pay | Admitting: Adult Health

## 2024-06-09 ENCOUNTER — Ambulatory Visit: Admitting: Adult Health

## 2024-06-25 NOTE — Progress Notes (Signed)
 [facility]   HOME SLEEP TEST REPORT ( by [facility]  mail -out device )    Dylan Shepard, January 17, 1996     STUDY DATE:   05-29-2024 Data received : 06-25-2024   ORDERING CLINICIAN:  Whitfield, NP REFERRING CLINICIANBETHA Amy, NP    CLINICAL INFORMATION/HISTORY:  seizure patient , primary neurologist Dr Rosemarie, MD  Referred by NP McCue for a HST.  Last visit  Update 11/26/2023 JM: Patient returns for follow-up visit.  At prior visit, reported continued focal type seizures and possible nocturnal seizures as well as migraine type headaches, increase topiramate  to 50 mg twice daily and continue Keppra  1500 mg twice daily.  MyChart message was sent about a month after medication adjustments, he reported decreased seizure activity although still persistent headaches, he also noted increased daytime fatigue and generalized muscle weakness, it was recommended to take 2 50mg  tablets at night (instead of BID) which he did for a couple weeks then returned back to BID dosing.  He is no longer having fatigue side effects, he did notice some worsening of baseline stuttering initially but this has since resolved and currently at baseline.   Currently, he reports decreased seizure activity, reports about 2-3 per month starring episodes and 1-2 monthly nocturnal seizures per month (previously staring episodes 2-3 times per week and nocturnal seizures 1 to 2/week).    He also notes improvement of headaches, currently having about 1 every 2 weeks, previously a couple times per week.          Epworth sleepiness score: X/24. FFS at  X / 63 points   BMI: X kg/m  Neck Circumference: X   FINDINGS:  Sleep Summary:   Start of Recording Time (hours, min): 06-09-2024, 23:39 hours        Total Sleep Time (hours, min):   5 h  Sleep efficiency %;     71%                                  Respiratory Indices by CMS criteria of scoring;    Calculated pAHI (per hour):   3.7/h                                                Positional  respiratory activity  / snoring : mild to moderate, intermittent.   Oxygen Saturation  in Sleep    Oxygen Saturation (%) Mean:    96.3%               O2 Saturation Range (%):  74.2 and 99.7%                                      O2 Saturation (minutes) <89%: 0 minutes           Pulse Rate in Sleep :   Pulse Mean (bpm):  62 bpm , regular.                Pulse Range:   50- 79 bpm , in NSR              IMPRESSION:  No evidence of sleep apnea was found.    RECOMMENDATION: Please confer with  referring provider and primary neurologist    Any Patient endorsing a high level of sleepiness should be cautioned not to drive, work at heights, or operate dangerous machinery or heavy equipment when tired or sleepy.  Review of good sleep hygiene measures took place in the initial consultation but should be revisited ( Your guide to better sleep  a publication by the NIH is a good source of information).   The referring provider will be notified of the test results.    I certify that I have reviewed the raw data recording prior to the issuance of this report in accordance with the standards of the American Academy of Sleep Medicine (AASM).    INTERPRETING PHYSICIAN:   Dedra Gores, MD  [facility] and [facility] Sleep Board certified by The [employer] of Sleep Medicine and Diplomate of the [employer] of Sleep Medicine. Board certified In Neurology through the ABPN, Fellow of the [employer] of Neurology.

## 2024-06-27 NOTE — Procedures (Signed)
 [facility]   HOME SLEEP TEST REPORT ( by [facility]  mail -out device )    Dylan Shepard, 17-Oct-1995     STUDY DATE:   05-29-2024 Data received : 06-25-2024   ORDERING CLINICIAN:  Whitfield, NP REFERRING CLINICIANBETHA Amy, NP    CLINICAL INFORMATION/HISTORY:  seizure patient , primary neurologist Dr Rosemarie, MD  Referred by NP McCue for a HST.  Last visit  Update 11/26/2023 JM: Patient returns for follow-up visit.  At prior visit, reported continued focal type seizures and possible nocturnal seizures as well as migraine type headaches, increase topiramate  to 50 mg twice daily and continue Keppra  1500 mg twice daily.  MyChart message was sent about a month after medication adjustments, he reported decreased seizure activity although still persistent headaches, he also noted increased daytime fatigue and generalized muscle weakness, it was recommended to take 2 50mg  tablets at night (instead of BID) which he did for a couple weeks then returned back to BID dosing.  He is no longer having fatigue side effects, he did notice some worsening of baseline stuttering initially but this has since resolved and currently at baseline.   Currently, he reports decreased seizure activity, reports about 2-3 per month starring episodes and 1-2 monthly nocturnal seizures per month (previously staring episodes 2-3 times per week and nocturnal seizures 1 to 2/week).    He also notes improvement of headaches, currently having about 1 every 2 weeks, previously a couple times per week.          Epworth sleepiness score: X/24. FFS at  X / 63 points   BMI: X kg/m  Neck Circumference: X   FINDINGS:  Sleep Summary:   Start of Recording Time (hours, min): 06-09-2024, 23:39 hours        Total Sleep Time (hours, min):   5 h  Sleep efficiency %;     71%                                  Respiratory Indices by CMS criteria of scoring;    Calculated pAHI (per hour):   3.7/h                                                Positional  respiratory activity  / snoring : mild to moderate, intermittent.   Oxygen Saturation  in Sleep    Oxygen Saturation (%) Mean:    96.3%               O2 Saturation Range (%):  74.2 and 99.7%                                      O2 Saturation (minutes) <89%: 0 minutes           Pulse Rate in Sleep :   Pulse Mean (bpm):  62 bpm , regular.                Pulse Range:   50- 79 bpm , in NSR              IMPRESSION:  No evidence of sleep apnea was found.    RECOMMENDATION: Please confer with  referring provider and primary neurologist    Any Patient endorsing a high level of sleepiness should be cautioned not to drive, work at heights, or operate dangerous machinery or heavy equipment when tired or sleepy.  Review of good sleep hygiene measures took place in the initial consultation but should be revisited ( Your guide to better sleep  a publication by the NIH is a good source of information).   The referring provider will be notified of the test results.    I certify that I have reviewed the raw data recording prior to the issuance of this report in accordance with the standards of the American Academy of Sleep Medicine (AASM).    INTERPRETING PHYSICIAN:   Dedra Gores, MD  [facility] and [facility] Sleep Board certified by The [employer] of Sleep Medicine and Diplomate of the [employer] of Sleep Medicine. Board certified In Neurology through the ABPN, Fellow of the [employer] of Neu[employer].

## 2024-06-29 ENCOUNTER — Ambulatory Visit: Payer: Self-pay | Admitting: Adult Health

## 2024-07-01 NOTE — Progress Notes (Unsigned)
 [facility] [address] [place]. [place] [address] [phone]       OFFICE FOLLOW UP NOTE  Dylan Shepard Date of Birth:  Dec 06, 1995 Medical Record Number:  [id number]    Primary neurologist: Dr. Rosemarie Referring MD: Dylan Shepard  Reason for referral: Seizures Reason for visit: starring off episodes and headaches   No chief complaint on file.     HPI:   Update 07/02/2024 JM: Patient returns for follow-up visit.  Seen in the ED back in September with reports of breakthrough seizure and complaints of migraine after event.  He was provided fluids and Toradol  with improvement of migraine.  Denies any missed dosages of Keppra  although did mention increased stressors and advised to continue current dosage.   Completed sleep study last month which did not show evidence of sleep disordered breathing.       History provided for reference purposes only Update 11/26/2023 JM: Patient returns for follow-up visit.  At prior visit, reported continued focal type seizures and possible nocturnal seizures as well as migraine type headaches, increase topiramate  to 50 mg twice daily and continue Keppra  1500 mg twice daily.  MyChart message was sent about a month after medication adjustments, he reported decreased seizure activity although still persistent headaches, he also noted increased daytime fatigue and generalized muscle weakness, it was recommended to take 2 50mg  tablets at night (instead of BID) which he did for a couple weeks then returned back to BID dosing.  He is no longer having fatigue side effects, he did notice some worsening of baseline stuttering initially but this has since resolved and currently at baseline.  Currently, he reports decreased seizure activity, reports about 2-3 per month starring episodes and 1-2 monthly nocturnal seizures per month (previously staring episodes 2-3 times per week and nocturnal seizures 1 to 2/week).   He also notes  improvement of headaches, currently having about 1 every 2 weeks, previously a couple times per week.    Update 05/29/2023 JM: Patient returns for follow-up visit with prior visit almost 2 years ago.  At prior visit, complained of staring episodes about 2-3 times per week as well as occipital headaches.  He was started on topiramate  25 mg twice daily in addition to Keppra  1500 mg twice daily.  Repeat EEG negative for seizures.  Reports prolonged time since visit due to loss of insurance, his PCP was assisting with medication refills. He reports continued staring episodes about 2-3 times per week. Also concerned of nocturnal seizures as he can wake up feeling out of it with a headache which will gradually improve, no tongue biting or incontinence, occurs about 1-2 times per week.  Denies any generalized tonic-clonic seizures.  He continues to have headaches a couple times per week, can be triggered by weather and bariatric changes, associated with photophobia and nausea, use of Tylenol  as needed with benefit.  Reports compliance on Keppra  1500 mg twice daily and topiramate  25 mg twice daily, denies any missed dosages, denies side effects.  He continues to work but questions short-term/partial disability as recurrent seizures interfering with working.  Update 08/09/2021 JM: Returns for follow-up visit per patient request after prior initial consult visit with Dr. Rosemarie almost 1 year ago (although recommended 56-month follow-up).   C/o headaches started beginning of December - occipital area.  Tightness/pressure sensation.  Can be associated with photophobia, phonophobia and N/V.  Initially occurring daily but gradually improving over the past couple of weeks - reports 3 headaches past 2  weeks. No prior hx of migraines or headaches.  Not associated with visual changes. At times, can be debilitating. Will use Tylenol  or ibuprofen  occasionally with some benefit but will quickly return. Denies neck pain or  stiffness. Denies fatigue or issues with sleeping.   Since November, reports being told he is having starring episodes. Unsure how long these last for. Will feel disoriented after but quickly return back to baseline. Is unaware of this occurring and no symptoms prior. Reports being told of these events 2-3x per week. Hasn't had any events since beginning of this month. These have never happened before. Typical seizures generalized tonic-clonic.   Does endorse increased stressors in November and December, working at [employer] during holiday season with increased hours. His hours have since been reduced since beginning of this month.  Reports compliance on Keppra  1500 mg twice daily -denies side effects. Denies any other factors such as any medication changes around onset or illness. He does not have an established PCP   MRI BRAIN w/wo contrast 09/10/2020 IMPRESSION:  Normal MRI brain (with and without).    EEG  09/19/2020 Summary  Normal electroencephalogram, awake, asleep and with activation procedures. There are no focal lateralizing or epileptiform features.   04/15/2020 (completed at [facility] neurology) abnormal awake and asleep EEG due to the presence of phase reversing sharp waves over the left temporal region.  This is felt to be consistent with a focal area of neuronal dysfunction with epileptogenic potential over the left temporal area.  No electrographic seizures were seen.   Consult visit 08/24/2020 Dr. Rosemarie: [name] is a 28 year old African-American male seen today for initial consultation visit for seizures.  He is accompanied by his mother.  History is obtained from them and review of recent electronic medical records from ER visit.  Patient states he has history of seizure disorder since age 28 when he was in seventh grade.  Seizures are usually generalized tonic-clonic without any aura.  He is unconscious.  Usually is out for about 5 minutes then wakes up disoriented confused and is tired  for the rest of the day.  Denies specific aura or triggers except running out of medications for being under stress.  He has been on Keppra  before and was on 1250 mg twice daily for 6 years when the dose was increased 2 months ago to 1500 twice daily as he had a couple of breakthrough seizures.  His seizure frequency has been barely 1 or 2/year until 2015 and when he went to college his seizure frequency went up and this was related to stress and hence the dose of Keppra  was increased from 500 twice daily to 1250 twice daily.  After he left college is done well and did not have any breakthrough seizures until October December last year when he had 3 or 4 seizures in that.  Patient attribute this to his altered sleep pattern since he was working some jobs where he had to work night shifts and did not get reliable sleep.  Patient ran out of seizure medications and went to the ER on 06/22/2020 and the ER physician spoke to neurologist on-call who recommended adding Vimpat  50 mg twice daily to his Keppra  but patient has not filled that prescription and remains on Keppra  alone.  He has had no more seizures since the ER visit 2 months ago.  The patient's mother informs me that patient probably had some perinatal hypoxia is she needed him emergency C-section since patient was stuck in the  birth canal during her delivery.  Patient has had decreased size of his right eye and face but is not sure if he has decreased size of the right arm or leg he has fairly good strength.  He has not had any intellectual or milestone delays.  Patient was followed at [facility] neurology in [place] and he has recently taken up a job in [place] and moved here but I do not have any of those records.  Patient has shown me EEG report from [facility] in [place]  which he has on his cell phone from 04/21/2020 which showed left temporal sharp waves with phase reversal at T3     ROS:   14 system review of  systems is positive for those listed in HPI and all other systems negative    PMH:  Past Medical History:  Diagnosis Date   Migraines    Seizures (HCC)     Social History:  Social History   Socioeconomic History   Marital status: Single    Spouse name: Not on file   Number of children: 0   Years of education: Not on file   Highest education level: Bachelor's degree (e.g., BA, AB, BS)  Occupational History   Occupation: part time x2 jobs  Tobacco Use   Smoking status: Never   Smokeless tobacco: Never  Vaping Use   Vaping status: Never Used  Substance and Sexual Activity   Alcohol use: Yes    Comment: occassionally   Drug use: Never   Sexual activity: Not on file  Other Topics Concern   Not on file  Social History Narrative   Lives with roommate   Right Handed   No caffeine   Social Drivers of Health   Tobacco Use: Low Risk (05/14/2024)   Received from [facility]   Patient History    Passive Exposure: Not on file    Smoking Tobacco Use: Never    Smokeless Tobacco Use: Never  Financial Resource Strain: Not on file  Food Insecurity: Not on file  Transportation Needs: Not on file  Physical Activity: Not on file  Stress: Not on file  Social Connections: Not on file  Intimate Partner Violence: Not on file  Depression (EYV7-0): Not on file  Alcohol Screen: Not on file  Housing: Not on file  Utilities: Not on file  Health Literacy: Not on file    Medications:   Current Outpatient Medications on File Prior to Visit  Medication Sig Dispense Refill   doxycycline  (VIBRAMYCIN ) 100 MG capsule Take 1 capsule (100 mg total) by mouth 2 (two) times daily. 20 capsule 0   levETIRAcetam  (KEPPRA ) 750 MG tablet Take 2 tablets (1,500 mg total) by mouth 2 (two) times daily. 360 tablet 3   neomycin -bacitracin -polymyxin (NEOSPORIN) OINT Apply 1 Application topically 3 (three) times daily. 28.4 g 0   topiramate  (TOPAMAX ) 50 MG tablet Take 2 tablets (100 mg  total) by mouth 2 (two) times daily. 120 tablet 5   No current facility-administered medications on file prior to visit.    Allergies:   Allergies  Allergen Reactions   Sudafed [Pseudoephedrine]     Not allergic, just interacts with Keppra     Physical Exam There were no vitals filed for this visit.  There is no height or weight on file to calculate BMI.   General: Obese very pleasant young African-American male, seated, in no evident distress Head: head normocephalic and atraumatic.   Neck: supple with no  carotid or supraclavicular bruits Cardiovascular: regular rate and rhythm, no murmurs Musculoskeletal: no deformity Skin:  no rash/petichiae Vascular:  Normal pulses all extremities  Neurologic Exam Mental Status: Awake and fully alert. Oriented to place and time. Recent and remote memory intact. Attention span, concentration and fund of knowledge appropriate. Mood and affect appropriate.  Cranial Nerves: Right eye is small in caliber with decreased palpebral fissure. Pupils equal, briskly reactive to light. Extraocular movements full without nystagmus. Visual fields full to confrontation. Hearing intact.  Very subtle hemiatrophy of the right face compared to the left (chronic).  Sensation intact. Face, tongue, palate moves normally and symmetrically.  Motor: Normal bulk and tone. Normal strength in all tested extremity muscles.  Mild right grip weakness. Sensory.: intact to touch , pinprick , position and vibratory sensation.  Coordination: Rapid alternating movements normal in all extremities. Finger-to-nose and heel-to-shin performed accurately bilaterally. Gait and Station: Arises from chair without difficulty. Stance is normal. Gait demonstrates normal stride length and balance . Able to heel, toe and tandem walk without difficulty.  Reflexes: 1+ and symmetric. Toes downgoing.       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 MR brain 08/2020 unremarkable. EEG 09/2020 unremarkable. C/o new onset headaches and starring episodes since Nov/Dec 2022 and possible nocturnal seizures which have gradually improved with initiation of topiramate  in addition to Keppra .    1.  Seizures -Gradual improvement of staring episodes and nocturnal seizures although still persist several times per month -Recommend further increasing topiramate  gradually to 100 mg twice daily (instructions provided in AVS), advised to call with any difficulty tolerating -Continue levetiracetam  1500 mg twice daily - Previously discussed consideration of VNS, previously discussed further with epileptologist Dr. Gregg who recommended optimizing current medications and if seizures persist, could further consider.  Patient was advised to call if seizures persist after dosage adjustment and may consider further evaluation with Dr. Gregg -Repeat EEG 08/2021 negative -consider 72 hr EEG although seeing how they have gradually improved, will hold off at this time -Routine lab work by PCP -Avoid seizure triggers such as stress, sleep deprivation, medication noncompliance, drastic change in dietary habits, etc -No driving for 6 months after seizure activity per [place] law   2.  Headaches -suspect mixed migrainous and tension type (some migrainous features with photophobia and nausea) -Improved on higher dose of topiramate  - Continue topiramate  with dosage increase as noted above -Use of Tylenol  as needed    Follow-up in 6 months or call earlier if needed    CC:  Rosalea Rosina SAILOR, PA   I spent 25 minutes of face-to-face and non-face-to-face time with patient.  This included previsit chart review, lab review, study review, order entry, electronic  health record documentation, patient education and discussion regarding above diagnoses and treatment plan and answered all other questions to patient's satisfaction  Harlene Bogaert, [facility]  [facility] [address] [place], [place] [address]  Phone [phone] Fax [phone] Note: This document was prepared with digital dictation and possible smart phrase technology. Any transcriptional errors that result from this process are unintentional.

## 2024-07-02 ENCOUNTER — Ambulatory Visit: Admitting: Adult Health

## 2024-07-02 ENCOUNTER — Encounter: Payer: Self-pay | Admitting: Adult Health

## 2024-07-02 VITALS — BP 124/72 | HR 80 | Ht 65.0 in | Wt 199.0 lb

## 2024-07-02 DIAGNOSIS — R569 Unspecified convulsions: Secondary | ICD-10-CM

## 2024-07-02 DIAGNOSIS — G44209 Tension-type headache, unspecified, not intractable: Secondary | ICD-10-CM | POA: Diagnosis not present

## 2024-07-02 DIAGNOSIS — G43909 Migraine, unspecified, not intractable, without status migrainosus: Secondary | ICD-10-CM

## 2024-07-02 DIAGNOSIS — G40909 Epilepsy, unspecified, not intractable, without status epilepticus: Secondary | ICD-10-CM

## 2024-07-02 MED ORDER — TOPIRAMATE 50 MG PO TABS: 150.0000 mg | ORAL_TABLET | Freq: Two times a day (BID) | ORAL | 11 refills | Status: DC

## 2024-07-02 MED ORDER — LEVETIRACETAM 750 MG PO TABS: 1500.0000 mg | ORAL_TABLET | Freq: Two times a day (BID) | ORAL | 3 refills | Status: DC

## 2024-07-02 MED ORDER — SUMATRIPTAN SUCCINATE 100 MG PO TABS: 100.0000 mg | ORAL_TABLET | ORAL | 2 refills | Status: AC | PRN

## 2024-07-02 MED ORDER — NAYZILAM 5 MG/0.1ML NA SOLN: 5.0000 mg | NASAL | 2 refills | Status: AC | PRN

## 2024-07-02 NOTE — Patient Instructions (Addendum)
 Your Plan:  Increase topamax  to 150mg  twice daily  Continue keppra  1500mg  twice daily   Start sumatriptan  as needed for migraine rescue, can repeat x1 after 2 hrs if needed. Max dose 2 tablets in 24 hours. Try to limit to no more than 2-3x per week. If no benefit or difficulty tolerating, please let me know and we can consider other options  Start Nayzilam  as needed for seizure rescue. Use 5 mg (1 spray) as a single dose in 1 nostril at onset of seizure or sensation seizure may occur; may repeat same dose in 10 minutes in alternate nostril based on response and tolerability (do not repeat if you are having trouble breathing or excessive sedation). Maximum dose: 10 mg (2 sprays) per single episode     Follow up in 4 months or call earlier if needed      Thank you for coming to see us  at [facility]. I hope we have been able to provide you high quality care today.  You may receive a patient satisfaction survey over the next few weeks. We would appreciate your feedback and comments so that we may continue to improve ourselves and the health of our patients.

## 2024-07-03 LAB — BASIC METABOLIC PANEL WITH GFR
BUN/Creatinine Ratio: 8 — ABNORMAL LOW (ref 9–20)
BUN: 9 mg/dL (ref 6–20)
CO2: 22 mmol/L (ref 20–29)
Calcium: 9.4 mg/dL (ref 8.7–10.2)
Chloride: 108 mmol/L — ABNORMAL HIGH (ref 96–106)
Creatinine, Ser: 1.14 mg/dL (ref 0.76–1.27)
Glucose: 96 mg/dL (ref 70–99)
Potassium: 4.4 mmol/L (ref 3.5–5.2)
Sodium: 143 mmol/L (ref 134–144)
eGFR: 90 mL/min/1.73

## 2024-07-06 ENCOUNTER — Ambulatory Visit: Payer: Self-pay | Admitting: Adult Health

## 2024-07-16 ENCOUNTER — Encounter: Payer: Self-pay | Admitting: Adult Health

## 2024-07-16 DIAGNOSIS — R569 Unspecified convulsions: Secondary | ICD-10-CM

## 2024-07-20 MED ORDER — LEVETIRACETAM 750 MG PO TABS: 1500.0000 mg | ORAL_TABLET | Freq: Two times a day (BID) | ORAL | 3 refills | Status: AC

## 2024-07-20 MED ORDER — TOPIRAMATE 50 MG PO TABS: 150.0000 mg | ORAL_TABLET | Freq: Two times a day (BID) | ORAL | 11 refills | Status: AC

## 2024-11-09 ENCOUNTER — Ambulatory Visit: Admitting: Adult Health
